# Patient Record
Sex: Female | Born: 1990 | Race: Black or African American | Hispanic: No | Marital: Single | Smoking: Never smoker
Health system: Southern US, Community
[De-identification: ages and names within clinical notes are randomized; demographics above are authoritative.]

## PROBLEM LIST (undated history)

## (undated) ENCOUNTER — Inpatient Hospital Stay (HOSPITAL_COMMUNITY): Payer: Self-pay

## (undated) DIAGNOSIS — R51 Headache: Secondary | ICD-10-CM

## (undated) HISTORY — PX: HERNIA REPAIR: SHX51

## (undated) HISTORY — PX: WISDOM TOOTH EXTRACTION: SHX21

---
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
## 2008-06-24 ENCOUNTER — Emergency Department (HOSPITAL_COMMUNITY): Admission: EM | Admit: 2008-06-24 | Discharge: 2008-06-25 | Payer: Self-pay | Admitting: Emergency Medicine

## 2008-11-04 ENCOUNTER — Emergency Department (HOSPITAL_COMMUNITY): Admission: EM | Admit: 2008-11-04 | Discharge: 2008-11-04 | Payer: Self-pay | Admitting: Family Medicine

## 2008-11-08 ENCOUNTER — Emergency Department (HOSPITAL_COMMUNITY): Admission: EM | Admit: 2008-11-08 | Discharge: 2008-11-08 | Payer: Self-pay | Admitting: Emergency Medicine

## 2009-06-06 ENCOUNTER — Emergency Department (HOSPITAL_COMMUNITY): Admission: EM | Admit: 2009-06-06 | Discharge: 2009-06-07 | Payer: Self-pay | Admitting: Emergency Medicine

## 2010-08-16 ENCOUNTER — Emergency Department (HOSPITAL_COMMUNITY): Admission: EM | Admit: 2010-08-16 | Discharge: 2010-08-16 | Payer: Self-pay | Admitting: Family Medicine

## 2011-01-23 LAB — DIFFERENTIAL
Eosinophils Absolute: 0.2 10*3/uL (ref 0.0–0.7)
Eosinophils Relative: 2 % (ref 0–5)
Lymphs Abs: 3.1 10*3/uL (ref 0.7–4.0)
Monocytes Absolute: 0.9 10*3/uL (ref 0.1–1.0)
Monocytes Relative: 8 % (ref 3–12)

## 2011-01-23 LAB — POCT I-STAT, CHEM 8
BUN: 13 mg/dL (ref 6–23)
Calcium, Ion: 1.2 mmol/L (ref 1.12–1.32)
Creatinine, Ser: 0.7 mg/dL (ref 0.4–1.2)
Glucose, Bld: 97 mg/dL (ref 70–99)
Hemoglobin: 13.6 g/dL (ref 12.0–15.0)
TCO2: 24 mmol/L (ref 0–100)

## 2011-01-23 LAB — CBC
HCT: 37.8 % (ref 36.0–46.0)
Hemoglobin: 12.8 g/dL (ref 12.0–15.0)
MCV: 85.7 fL (ref 78.0–100.0)
Platelets: 267 10*3/uL (ref 150–400)
WBC: 11 10*3/uL — ABNORMAL HIGH (ref 4.0–10.5)

## 2011-02-01 LAB — CBC
HCT: 38.5 % (ref 36.0–46.0)
MCHC: 33.3 g/dL (ref 30.0–36.0)
MCV: 85.5 fL (ref 78.0–100.0)
Platelets: 370 10*3/uL (ref 150–400)

## 2011-02-01 LAB — DIFFERENTIAL
Basophils Relative: 0 % (ref 0–1)
Eosinophils Absolute: 0.4 10*3/uL (ref 0.0–0.7)
Eosinophils Relative: 2 % (ref 0–5)
Neutrophils Relative %: 81 % — ABNORMAL HIGH (ref 43–77)

## 2011-02-01 LAB — POCT I-STAT, CHEM 8
Creatinine, Ser: 0.8 mg/dL (ref 0.4–1.2)
Glucose, Bld: 121 mg/dL — ABNORMAL HIGH (ref 70–99)
HCT: 44 % (ref 36.0–46.0)
Hemoglobin: 15 g/dL (ref 12.0–15.0)
Potassium: 3.4 mEq/L — ABNORMAL LOW (ref 3.5–5.1)
Sodium: 142 mEq/L (ref 135–145)
TCO2: 25 mmol/L (ref 0–100)

## 2011-03-02 NOTE — Op Note (Signed)
NAMECLARITA, April Malone                ACCOUNT NO.:  [id number]   MEDICAL RECORD NO.:  [id number]          PATIENT TYPE:  EMS   LOCATION:  ED                           FACILITY:  [facility]   PHYSICIAN:  Antony Contras, MD     DATE OF BIRTH:  1991-09-17   DATE OF PROCEDURE:  11/08/2008  DATE OF DISCHARGE:  11/08/2008                               OPERATIVE REPORT   PREOPERATIVE DIAGNOSIS:  Right peritonsillar abscess.   POSTOPERATIVE DIAGNOSIS:  Right peritonsillar abscess.   PROCEDURE:  Incision and drainage of right peritonsillar abscess.   SURGEON:  Antony Contras, MD   ANESTHESIA:  Local.   COMPLICATIONS:  None.   INDICATIONS:  Patient is a 20 year old African American female who has a  10-day history of sore throat that has progressed and become right-  sided.  The patient has fullness of the right peritonsillar region,  pushing the uvula to the left side.  Incision and drainage was  recommended.   FINDINGS:  Physical findings are as above.  Upon incision, there was not  flow of yellow pus, but there was a cavity entered with the hemostat  with dark blood encountered.   DESCRIPTION OF PROCEDURE:  The patient was identified in the emergency  department, and informed consent was obtained, including discussion of  risks, benefits and alternatives.  The right oropharynx was sprayed with  Cetacaine 3 different times and then injected with 1% lidocaine with  1:100,000 epinephrine.  A horizontal incision was made with an 11 blade  scalpel just above the right tonsil, and the wound was then dissected  using a curved hemostat.  Without finding anything, an 18-gauge needle  and a 10-cc syringe was used to aspirate through the region.  Once of  these passes yielded about 2 cc of dark red fluid.  This was followed by  the hemostat which entered into a cavity deep to the tonsil.  The  patient tolerated the procedure well without complication.  Patient was  returned to the emergency room  care.      Antony Contras, MD  Electronically Signed     DDB/MEDQ  D:  11/08/2008  T:  11/08/2008  Job:  [id number]

## 2011-03-02 NOTE — Consult Note (Signed)
April Malone, April Malone                ACCOUNT NO.:  [id number]   MEDICAL RECORD NO.:  [id number]          PATIENT TYPE:  EMS   LOCATION:  ED                           FACILITY:  [facility]   PHYSICIAN:  Antony Contras, MD     DATE OF BIRTH:  December 16, 1990   DATE OF CONSULTATION:  11/08/2008  DATE OF DISCHARGE:  11/08/2008                                 CONSULTATION   CHIEF COMPLAINT:  Sore throat.   HISTORY OF PRESENT ILLNESS:  Patient is a 20 year old African American  female who developed sore throat about 10 days ago that has gradually  progressed.  She went to urgent care 3 days ago with pain becoming more  isolated to the right side and having difficulty swallowing.  She was  diagnosed with strep throat without a strep test and prescribed  penicillin.  She has worsened over the last few days, however, to have  severe pain, making it difficult to swallow her secretions.  She also  has otalgia in the right ear.  She presents to the emergency department  with these symptoms.  She has no other complaints.   PAST MEDICAL HISTORY:  None.   PAST SURGICAL HISTORY:  None.   MEDICATIONS:  Penicillin.   ALLERGIES:  No known drug allergies.   FAMILY HISTORY:  None.   Denies alcohol or smoking.   REVIEW OF SYSTEMS:  Negative except as listed above.   PHYSICAL EXAMINATION:  Temperature 98.6, blood pressure 129/86, pulse  93, respirations 18.  GENERAL:  Patient is in no acute distress and is pleasant and  cooperative.  EYES:  Extraocular movements are intact.  Pupils are equal, round and  reactive to light.  EARS:  External ears are normal.  External canals are patent.  Tympanic  membranes are intact with aerated middle ears.  NOSE:  External nose is normal.  Nasal passages are patent with relative  midline septum.  OROPHARYNX/ORAL CAVITY:  Lips, teeth, and gums are normal.  The tongue,  floor of mouth, buccal mucosa, and palate are normal.  The oropharynx  reveals fullness of the right  peritonsillar region, pushing the uvula  towards the left side.  There is exudate on the tonsil.  FACE:  No abnormalities.  NECK:  Tender in zone 2 on the right side but otherwise no mass or  deformity.  LYMPHATICS:  No enlargement of the neck.  THYROID:  Normal to palpation.  SALIVARY GLANDS:  Normal to palpation.  CRANIAL NERVES II-XII:  Grossly intact.   LABS:  White blood count 16.8 with 89% neutrophils.   ASSESSMENT:  Patient is a 20 year old African American female with a  right peritonsillar abscess.   PLAN:  The abscess will be drained in the emergency department under a  local anesthetic.  Risks, benefits and alternatives were discussed.  Patient will be discharged on Augmentin, Medrol Dosepak, and Lortab  Elixir.  She was given intravenous Clindamycin and Solu-Medrol in the  emergency department.  Followup will be in 1 week in my office.      Antony Contras, MD  Electronically  Signed     DDB/MEDQ  D:  11/08/2008  T:  11/08/2008  Job:  [id number]

## 2011-07-21 LAB — RAPID STREP SCREEN (MED CTR MEBANE ONLY): Streptococcus, Group A Screen (Direct): NEGATIVE

## 2011-07-28 LAB — CBC
HCT: 36.7
Hemoglobin: 11.8 — ABNORMAL LOW
MCHC: 34.1
MCV: 89.8
Platelets: 198
RDW: 12.7
RDW: 12.8
WBC: 10.2 — ABNORMAL HIGH

## 2011-07-28 LAB — RPR: RPR Ser Ql: NONREACTIVE

## 2011-07-29 LAB — URINALYSIS, ROUTINE W REFLEX MICROSCOPIC
Bilirubin Urine: NEGATIVE
Glucose, UA: NEGATIVE
Hgb urine dipstick: NEGATIVE
Ketones, ur: NEGATIVE
Nitrite: NEGATIVE
Protein, ur: NEGATIVE
Protein, ur: NEGATIVE
Specific Gravity, Urine: <1.005 — ABNORMAL LOW
Urobilinogen, UA: 0.2
pH: 7

## 2011-07-29 LAB — URINE MICROSCOPIC-ADD ON

## 2011-07-30 LAB — URINALYSIS, ROUTINE W REFLEX MICROSCOPIC
Bilirubin Urine: NEGATIVE
Glucose, UA: NEGATIVE
Nitrite: NEGATIVE
Specific Gravity, Urine: 1.025
pH: 5.5

## 2011-07-30 LAB — URINE MICROSCOPIC-ADD ON: RBC / HPF: NONE SEEN

## 2012-07-13 ENCOUNTER — Inpatient Hospital Stay (HOSPITAL_COMMUNITY)
Admission: AD | Admit: 2012-07-13 | Discharge: 2012-07-14 | Disposition: A | Discharge status: Home / Self Care | Payer: Self-pay | Source: Ambulatory Visit | Attending: Obstetrics | Admitting: Obstetrics

## 2012-07-13 ENCOUNTER — Encounter (HOSPITAL_COMMUNITY): Payer: Self-pay | Admitting: *Deleted

## 2012-07-13 DIAGNOSIS — N920 Excessive and frequent menstruation with regular cycle: Secondary | ICD-10-CM | POA: Insufficient documentation

## 2012-07-13 DIAGNOSIS — N921 Excessive and frequent menstruation with irregular cycle: Secondary | ICD-10-CM

## 2012-07-13 HISTORY — DX: Headache: R51

## 2012-07-13 NOTE — MAU Note (Signed)
Pt reports she had Implanon replaced in Sept. States she has been spotting for about 5 weeks. States over the last few days has noticed "a few clots" but is still only spotting. Also reports a headache off/on since the bleeding started

## 2012-07-14 ENCOUNTER — Inpatient Hospital Stay (HOSPITAL_COMMUNITY): Payer: Self-pay

## 2012-07-14 DIAGNOSIS — N920 Excessive and frequent menstruation with regular cycle: Secondary | ICD-10-CM

## 2012-07-14 LAB — GC/CHLAMYDIA PROBE AMP, GENITAL: GC Probe Amp, Genital: NEGATIVE

## 2012-07-14 LAB — CBC
MCH: 28.1 pg (ref 26.0–34.0)
MCV: 82.8 fL (ref 78.0–100.0)
Platelets: 285 10*3/uL (ref 150–400)
RDW: 12.5 % (ref 11.5–15.5)
WBC: 10.5 10*3/uL (ref 4.0–10.5)

## 2012-07-14 LAB — WET PREP, GENITAL: Yeast Wet Prep HPF POC: NONE SEEN

## 2012-07-14 MED ORDER — MEGESTROL ACETATE 40 MG PO TABS: ORAL_TABLET | ORAL | Status: DC

## 2012-07-14 NOTE — MAU Provider Note (Signed)
Chief Complaint: Vaginal Bleeding   First Provider Initiated Contact with Patient 07/14/12 0023     SUBJECTIVE HPI: April Malone is a 21 y.o. G1P1001 who presents to maternity admissions reporting bleeding x4-5 weeks alternating between spotting and heavy bleeding with clots.  She has had an Implanon for 1 year but has not had bleeding like this until now.  She reports good fetal movement, denies vaginal itching/burning, urinary symptoms, h/a, dizziness, n/v, or fever/chills.     Past Medical History  Diagnosis Date  . Headache    Past Surgical History  Procedure Date  . Hernia repair    History   Social History  . Marital Status: Single    Spouse Name: N/A    Number of Children: N/A  . Years of Education: N/A   Occupational History  . Not on file.   Social History Main Topics  . Smoking status: Never Smoker   . Smokeless tobacco: Not on file  . Alcohol Use: No  . Drug Use: No  . Sexually Active:    Other Topics Concern  . Not on file   Social History Narrative  . No narrative on file   No current facility-administered medications on file prior to encounter.   No current outpatient prescriptions on file prior to encounter.   Allergies not on file  ROS: Pertinent items in HPI  OBJECTIVE Blood pressure 119/77, pulse 85, temperature 99.1 F (37.3 C), temperature source Oral, resp. rate 16, height 6\' 5"  (1.956 m), weight 101.606 kg (224 lb), SpO2 100.00%. GENERAL: Well-developed, well-nourished female in no acute distress.  HEENT: Normocephalic HEART: normal rate RESP: normal effort ABDOMEN: Soft, non-tender EXTREMITIES: Nontender, no edema NEURO: Alert and oriented Pelvic exam: Cervix pink, visually closed, without lesion, moderate amount bright red bleeding without clots, vaginal walls and external genitalia normal Bimanual exam: Cervix 0/long/high, firm, anterior, neg CMT, uterus  Mildly tender, nonenlarged, adnexa without tenderness, enlargement, or  mass  LAB RESULTS Results for orders placed during the hospital encounter of 07/13/12 (from the past 24 hour(s))  POCT PREGNANCY, URINE     Status: Normal   Collection Time   07/13/12  9:11 PM      Component Value Range   Preg Test, Ur NEGATIVE  NEGATIVE  CBC     Status: Normal   Collection Time   07/13/12 11:40 PM      Component Value Range   WBC 10.5  4.0 - 10.5 K/uL   RBC 4.48  3.87 - 5.11 MIL/uL   Hemoglobin 12.6  12.0 - 15.0 g/dL   HCT 16.1  09.6 - 04.5 %   MCV 82.8  78.0 - 100.0 fL   MCH 28.1  26.0 - 34.0 pg   MCHC 34.0  30.0 - 36.0 g/dL   RDW 40.9  81.1 - 91.4 %   Platelets 285  150 - 400 K/uL  WET PREP, GENITAL     Status: Abnormal   Collection Time   07/14/12 12:25 AM      Component Value Range   Yeast Wet Prep HPF POC NONE SEEN  NONE SEEN   Trich, Wet Prep NONE SEEN  NONE SEEN   Clue Cells Wet Prep HPF POC NONE SEEN  NONE SEEN   WBC, Wet Prep HPF POC FEW (*) NONE SEEN    IMAGING US Transvaginal Non-ob  07/14/2012  *RADIOLOGY REPORT*  Clinical Data: 21 year old female with spotting following implantable contraceptive.  TRANSABDOMINAL AND TRANSVAGINAL ULTRASOUND OF PELVIS Technique:  Both  transabdominal and transvaginal ultrasound examinations of the pelvis were performed. Transabdominal technique was performed for global imaging of the pelvis including uterus, ovaries, adnexal regions, and pelvic cul-de-sac.  It was necessary to proceed with endovaginal exam following the transabdominal exam to visualize the the adnexa.  Comparison:  None.  Findings:  Uterus: Retroflexed.  Normal echotexture.  7.0 x 3.7 x 5.2 cm.  Endometrium: Trilaminar and homogeneous, 6 mm in thickness.  Right ovary:  Normal with multiple small follicles.  4.6 x 2.6 x 3.0 cm.  Left ovary: Normal with multiple small follicles.  4.3 x 2.2 x 2.3 cm.  Other findings: Trace simple appearing free fluid in the cul-de- sac.  IMPRESSION: Normal study. No evidence of pelvic mass or other significant abnormality.    Original Report Authenticated By: Harley Hallmark, M.D.    US Pelvis Complete  07/14/2012  *RADIOLOGY REPORT*  Clinical Data: 21 year old female with spotting following implantable contraceptive.  TRANSABDOMINAL AND TRANSVAGINAL ULTRASOUND OF PELVIS Technique:  Both transabdominal and transvaginal ultrasound examinations of the pelvis were performed. Transabdominal technique was performed for global imaging of the pelvis including uterus, ovaries, adnexal regions, and pelvic cul-de-sac.  It was necessary to proceed with endovaginal exam following the transabdominal exam to visualize the the adnexa.  Comparison:  None.  Findings:  Uterus: Retroflexed.  Normal echotexture.  7.0 x 3.7 x 5.2 cm.  Endometrium: Trilaminar and homogeneous, 6 mm in thickness.  Right ovary:  Normal with multiple small follicles.  4.6 x 2.6 x 3.0 cm.  Left ovary: Normal with multiple small follicles.  4.3 x 2.2 x 2.3 cm.  Other findings: Trace simple appearing free fluid in the cul-de- sac.  IMPRESSION: Normal study. No evidence of pelvic mass or other significant abnormality.   Original Report Authenticated By: Harley Hallmark, M.D.     ASSESSMENT 1. Menometrorrhagia     PLAN Discharge home Megace taper Pt given contact info for Gyn clinic to f/u as needed Return to MAU as needed   Sharen Counter Certified Nurse-Midwife 07/14/2012  12:27 AM

## 2013-02-01 ENCOUNTER — Encounter (HOSPITAL_COMMUNITY): Payer: Self-pay | Admitting: *Deleted

## 2013-02-01 ENCOUNTER — Inpatient Hospital Stay (HOSPITAL_COMMUNITY)
Admission: AD | Admit: 2013-02-01 | Discharge: 2013-02-02 | Disposition: A | Discharge status: Home / Self Care | Payer: Self-pay | Source: Ambulatory Visit | Attending: Obstetrics & Gynecology | Admitting: Obstetrics & Gynecology

## 2013-02-01 DIAGNOSIS — L738 Other specified follicular disorders: Secondary | ICD-10-CM | POA: Insufficient documentation

## 2013-02-01 DIAGNOSIS — N9089 Other specified noninflammatory disorders of vulva and perineum: Secondary | ICD-10-CM

## 2013-02-01 DIAGNOSIS — N909 Noninflammatory disorder of vulva and perineum, unspecified: Secondary | ICD-10-CM | POA: Insufficient documentation

## 2013-02-01 NOTE — MAU Note (Signed)
Pt reports tonight she noticed "red bumps" on the left side of her vagina.burning sensation

## 2013-02-02 DIAGNOSIS — N9089 Other specified noninflammatory disorders of vulva and perineum: Secondary | ICD-10-CM

## 2013-02-02 NOTE — MAU Provider Note (Signed)
Chief Complaint: Groin Swelling   First Provider Initiated Contact with Patient 02/02/13 0049     SUBJECTIVE HPI: April Malone is a 22 y.o. G46P1001 female who presents with noticing a small red bump on her left labia immediately prior to MAU visit. Experienced burning in that location when she urinated. Describes the bump as red, non-vesicular, mildly tender. No Hx of same or HSV. Denies burning other than when she urinated. Denies vaginal discharge, VB, new sex partners.   Past Medical History  Diagnosis Date  . Headache    OB History   Grav Para Term Preterm Abortions TAB SAB Ect Mult Living   1 1 1       1      # Outc Date GA Lbr Len/2nd Wgt Sex Del Anes PTL Lv   1 TRM              Past Surgical History  Procedure Laterality Date  . Hernia repair     History   Social History  . Marital Status: Single    Spouse Name: N/A    Number of Children: N/A  . Years of Education: N/A   Occupational History  . Not on file.   Social History Main Topics  . Smoking status: Never Smoker   . Smokeless tobacco: Not on file  . Alcohol Use: No  . Drug Use: No  . Sexually Active: Yes    Birth Control/ Protection: Implant   Other Topics Concern  . Not on file   Social History Narrative  . No narrative on file   No current facility-administered medications on file prior to encounter.   No current outpatient prescriptions on file prior to encounter.   No Known Allergies  ROS: Pertinent items in HPI  OBJECTIVE Blood pressure 142/81, pulse 87, temperature 98.6 F (37 C), temperature source Oral, resp. rate 18, height 5' 3.5" (1.613 m), weight 101.152 kg (223 lb), last menstrual period 01/06/2013, SpO2 100.00%. GENERAL: Well-developed, well-nourished female in no acute distress. Anxious.  HEENT: Normocephalic HEART: normal rate RESP: normal effort ABDOMEN: Soft, non-tender EXTREMITIES: Nontender, no edema NEURO: Alert and oriented PELVIC EXAM: NEFG. Slightly erythematous 1  mm raised bump on left labia majora. Non-vesicular. Very difficult to find. Not able to culture. No discharge.   LAB RESULTS No results found for this or any previous visit (from the past 24 hour(s)).  IMAGING No results found.  MAU COURSE  ASSESSMENT 1. Labial lesion--folliculitis vs excoriated hair follicle    PLAN Discharge home.     Follow-up Information   Follow up with [facility]. (As needed of lesion worsens)    Contact information:   [address] [place] [address] [phone]       Medication List    STOP taking these medications       megestrol 40 MG tablet  Commonly known as:  MEGACE       Dorathy Kinsman, CNM 02/02/2013  1:26 AM

## 2013-02-03 NOTE — MAU Provider Note (Signed)
Attestation of Attending Supervision of Advanced Practitioner (PA/CNM/NP): Evaluation and management procedures were performed by the Advanced Practitioner under my supervision and collaboration.  I have reviewed the Advanced Practitioner's note and chart, and I agree with the management and plan.  Elani Delph, MD, FACOG Attending Obstetrician & Gynecologist Faculty Practice, [facility]  

## 2013-06-24 ENCOUNTER — Encounter (HOSPITAL_COMMUNITY): Payer: Self-pay | Admitting: *Deleted

## 2013-06-24 ENCOUNTER — Inpatient Hospital Stay (HOSPITAL_COMMUNITY)
Admission: AD | Admit: 2013-06-24 | Discharge: 2013-06-24 | Disposition: A | Discharge status: Home / Self Care | Payer: Self-pay | Source: Ambulatory Visit | Attending: Obstetrics & Gynecology | Admitting: Obstetrics & Gynecology

## 2013-06-24 DIAGNOSIS — L293 Anogenital pruritus, unspecified: Secondary | ICD-10-CM | POA: Insufficient documentation

## 2013-06-24 DIAGNOSIS — N644 Mastodynia: Secondary | ICD-10-CM | POA: Insufficient documentation

## 2013-06-24 DIAGNOSIS — B373 Candidiasis of vulva and vagina: Secondary | ICD-10-CM

## 2013-06-24 DIAGNOSIS — B3731 Acute candidiasis of vulva and vagina: Secondary | ICD-10-CM | POA: Insufficient documentation

## 2013-06-24 LAB — URINALYSIS, ROUTINE W REFLEX MICROSCOPIC
Bilirubin Urine: NEGATIVE
Ketones, ur: NEGATIVE mg/dL
Leukocytes, UA: NEGATIVE
Nitrite: NEGATIVE
Protein, ur: NEGATIVE mg/dL

## 2013-06-24 LAB — WET PREP, GENITAL: Clue Cells Wet Prep HPF POC: NONE SEEN

## 2013-06-24 NOTE — MAU Provider Note (Signed)
Attestation of Attending Supervision of Advanced Practitioner (CNM/NP): Evaluation and management procedures were performed by the Advanced Practitioner under my supervision and collaboration.  I have reviewed the Advanced Practitioner's note and chart, and I agree with the management and plan.  HARRAWAY-SMITH, Arseniy Toomey 2:03 PM

## 2013-06-24 NOTE — MAU Note (Signed)
Pt presents with complaints of breast pain that started approximately a week ago and vaginal irritation that started a couple of weeks ago. She says that she has had a yeast infection before and states that the vaginal irritation feels like it did then.

## 2013-06-24 NOTE — MAU Provider Note (Signed)
CC: Breast Pain and Vaginal Itching    First Provider Initiated Contact with Patient 06/24/13 1132      HPI STACI DACK is a 22 y.o. G1P1001 who presents with onset about 1 week ago of bilateral diffuse breast pain left greater than right.  No nipple discharge, fever, lumps. Caffeine intake is low. No similar previous or cyclic episodes. Implanon for contraception. LMP 2 wks aqo, regular monthly menses. Denies abnormal bleeding Also has one week history of vulvovaginal itching. She states her discharge is thick and white and she believes this is similar to symptoms she's had in the past with yeast. No new soaps or OTC tx.   Past Medical History  Diagnosis Date  . Headache(784.0)     OB History  Gravida Para Term Preterm AB SAB TAB Ectopic Multiple Living  1 1 1       1     # Outcome Date GA Lbr Len/2nd Weight Sex Delivery Anes PTL Lv  1 TRM               Past Surgical History  Procedure Laterality Date  . Hernia repair      History   Social History  . Marital Status: Single    Spouse Name: N/A    Number of Children: N/A  . Years of Education: N/A   Occupational History  . Not on file.   Social History Main Topics  . Smoking status: Never Smoker   . Smokeless tobacco: Not on file  . Alcohol Use: No  . Drug Use: No  . Sexual Activity: Yes    Birth Control/ Protection: Implant   Other Topics Concern  . Not on file   Social History Narrative  . No narrative on file    No current facility-administered medications on file prior to encounter.   No current outpatient prescriptions on file prior to encounter.    No Known Allergies  ROS Pertinent items in HPI  PHYSICAL EXAM Filed Vitals:   06/24/13 1123  BP: 132/87  Pulse: 96  Temp: 97.9 F (36.6 C)  Resp: 18   General: Well nourished, well developed female in no acute distress Cardiovascular: Normal rate Respiratory: Normal effort Breasts: Diffusely tender to palpation particularly left rest in  lower quadrants. There are no skin changes, nipple discharge, masses, lymphadenopathy.Abdomen: Soft, nontender Back: No CVAT Extremities: No edema Neurologic: Alert and oriented Speculum exam: NEFG, no erythema; vagina with thick adherent white discharge, no blood; cervix clean Bimanual exam: cervix closed, no CMT; uterus NSSP; no adnexal tenderness or masses   LAB RESULTS Results for orders placed during the hospital encounter of 06/24/13 (from the past 24 hour(s))  URINALYSIS, ROUTINE W REFLEX MICROSCOPIC     Status: None   Collection Time    06/24/13 11:05 AM      Result Value Range   Color, Urine YELLOW  YELLOW   APPearance CLEAR  CLEAR   Specific Gravity, Urine 1.020  1.005 - 1.030   pH 7.5  5.0 - 8.0   Glucose, UA NEGATIVE  NEGATIVE mg/dL   Hgb urine dipstick NEGATIVE  NEGATIVE   Bilirubin Urine NEGATIVE  NEGATIVE   Ketones, ur NEGATIVE  NEGATIVE mg/dL   Protein, ur NEGATIVE  NEGATIVE mg/dL   Urobilinogen, UA 0.2  0.0 - 1.0 mg/dL   Nitrite NEGATIVE  NEGATIVE   Leukocytes, UA NEGATIVE  NEGATIVE  POCT PREGNANCY, URINE     Status: None   Collection Time    06/24/13  11:14 AM      Result Value Range   Preg Test, Ur NEGATIVE  NEGATIVE  WET PREP, GENITAL     Status: Abnormal   Collection Time    06/24/13 11:30 AM      Result Value Range   Yeast Wet Prep HPF POC NONE SEEN  NONE SEEN   Trich, Wet Prep NONE SEEN  NONE SEEN   Clue Cells Wet Prep HPF POC NONE SEEN  NONE SEEN   WBC, Wet Prep HPF POC FEW (*) NONE SEEN      ASSESSMENT  1. Mastalgia   Presumptive yeast vaginitis  PLAN D/W Dr. Erin Fulling.  Limit caffeine and keep diary of pain. Continue Tylenol and ibuprofen. May use warm compresses or ice as needed for comfort. Reevaluate next month if continues after menstrual cycle.  Discharge home.  See AVS for patient education.    Medication List         ibuprofen 200 MG tablet  Commonly known as:  ADVIL,MOTRIN  Take 400 mg by mouth every 6 (six) hours as  needed for pain.     multivitamin with minerals Tabs tablet  Take 1 tablet by mouth daily.       May take ibuprofen 600 mg q6h prn   Danae Orleans, CNM 06/24/2013 11:39 AM

## 2013-06-25 LAB — GC/CHLAMYDIA PROBE AMP
CT Probe RNA: NEGATIVE
GC Probe RNA: NEGATIVE

## 2013-07-19 ENCOUNTER — Encounter: Payer: Self-pay | Admitting: Advanced Practice Midwife

## 2013-10-04 ENCOUNTER — Encounter (HOSPITAL_COMMUNITY): Payer: Self-pay | Admitting: *Deleted

## 2013-10-04 ENCOUNTER — Inpatient Hospital Stay (HOSPITAL_COMMUNITY)
Admission: AD | Admit: 2013-10-04 | Discharge: 2013-10-04 | Disposition: A | Discharge status: Home / Self Care | Payer: Medicaid Other | Source: Ambulatory Visit | Attending: Obstetrics & Gynecology | Admitting: Obstetrics & Gynecology

## 2013-10-04 DIAGNOSIS — N946 Dysmenorrhea, unspecified: Secondary | ICD-10-CM

## 2013-10-04 DIAGNOSIS — N949 Unspecified condition associated with female genital organs and menstrual cycle: Secondary | ICD-10-CM

## 2013-10-04 DIAGNOSIS — N938 Other specified abnormal uterine and vaginal bleeding: Secondary | ICD-10-CM | POA: Insufficient documentation

## 2013-10-04 DIAGNOSIS — Z975 Presence of (intrauterine) contraceptive device: Secondary | ICD-10-CM | POA: Insufficient documentation

## 2013-10-04 LAB — URINALYSIS, ROUTINE W REFLEX MICROSCOPIC
Bilirubin Urine: NEGATIVE
Ketones, ur: NEGATIVE mg/dL
Nitrite: NEGATIVE
Specific Gravity, Urine: 1.02 (ref 1.005–1.030)
pH: 6 (ref 5.0–8.0)

## 2013-10-04 LAB — CBC
HCT: 35.5 % — ABNORMAL LOW (ref 36.0–46.0)
Hemoglobin: 12.3 g/dL (ref 12.0–15.0)
MCHC: 34.6 g/dL (ref 30.0–36.0)
RDW: 12.2 % (ref 11.5–15.5)
WBC: 10.6 10*3/uL — ABNORMAL HIGH (ref 4.0–10.5)

## 2013-10-04 LAB — WET PREP, GENITAL
Clue Cells Wet Prep HPF POC: NONE SEEN
Trich, Wet Prep: NONE SEEN
Yeast Wet Prep HPF POC: NONE SEEN

## 2013-10-04 LAB — URINE MICROSCOPIC-ADD ON

## 2013-10-04 MED ORDER — NORGESTIMATE-ETH ESTRADIOL 0.25-35 MG-MCG PO TABS: 1.0000 | ORAL_TABLET | Freq: Every day | ORAL | Status: DC

## 2013-10-04 MED ORDER — TRAMADOL HCL 50 MG PO TABS: 50.0000 mg | ORAL_TABLET | Freq: Four times a day (QID) | ORAL | Status: DC | PRN

## 2013-10-04 MED ORDER — KETOROLAC TROMETHAMINE 60 MG/2ML IM SOLN
60.0000 mg | Freq: Once | INTRAMUSCULAR | Status: AC
  Administered 2013-10-04: 60 mg via INTRAMUSCULAR
  Filled 2013-10-04: qty 2

## 2013-10-04 NOTE — MAU Note (Signed)
Pt. Started bleeding around October 15th and she has been bleeding since then.  The bleeding has been constant and she has been changing her pad q 1-2 hours. She is also having intense cramping on and off since October.  Pt. Has also recently started having headaches.

## 2013-10-04 NOTE — MAU Provider Note (Signed)
CC: Vaginal Bleeding  HPI April Malone is a 22 y.o. G1P1001 with Nexplanon in place x 2 years has had vagina bleeding continuously since October 15. Flow waxes and wanes with 2-3 day periods of light flow, but mostly heavy requiring several pads per day. Bleeding was heavier when she passed a clot and felt weak last night. Denies orthostatic symptoms now. Prior to this episode she had some bleeding in the summer. She was seen in MAU 06/30/12 for vaginal bleeding and had normal [place] and successful treatment with a Megace taper.  Requests STI testing. Denies irritative discharge or new partner.    Past Medical History  Diagnosis Date  . Headache(784.0)     OB History  Gravida Para Term Preterm AB SAB TAB Ectopic Multiple Living  1 1 1       1     # Outcome Date GA Lbr Len/2nd Weight Sex Delivery Anes PTL Lv  1 TRM               Past Surgical History  Procedure Laterality Date  . Hernia repair      History   Social History  . Marital Status: Single    Spouse Name: N/A    Number of Children: N/A  . Years of Education: N/A   Occupational History  . Not on file.   Social History Main Topics  . Smoking status: Never Smoker   . Smokeless tobacco: Not on file  . Alcohol Use: No  . Drug Use: No  . Sexual Activity: Yes    Birth Control/ Protection: Implant   Other Topics Concern  . Not on file   Social History Narrative  . No narrative on file    No current facility-administered medications on file prior to encounter.   Current Outpatient Prescriptions on File Prior to Encounter  Medication Sig Dispense Refill  . ibuprofen (ADVIL,MOTRIN) 200 MG tablet Take 400 mg by mouth every 6 (six) hours as needed for pain.      . Multiple Vitamin (MULTIVITAMIN WITH MINERALS) TABS tablet Take 1 tablet by mouth daily.        No Known Allergies  ROS Pertinent items in HPI  PHYSICAL EXAM Filed Vitals:   10/04/13 2027  BP: 125/71  Pulse: 88  Temp: 98.3 F (36.8 C)  Resp: 18    General: Well nourished, well developed female in no acute distress Cardiovascular: Normal rate Respiratory: Normal effort Abdomen: Soft, nontender Back: No CVAT Extremities: No edema Neurologic: Alert and oriented Speculum exam: NEFG; vagina with moderate dark blood; cervix clean Bimanual exam: cervix closed, no CMT; uterus retroverted and difficult to outline due to body habitus; no adnexal tenderness or masses noted  LAB RESULTS Results for orders placed during the hospital encounter of 10/04/13 (from the past 24 hour(s))  WET PREP, GENITAL     Status: Abnormal   Collection Time    10/04/13  8:50 PM      Result Value Range   Yeast Wet Prep HPF POC NONE SEEN  NONE SEEN   Trich, Wet Prep NONE SEEN  NONE SEEN   Clue Cells Wet Prep HPF POC NONE SEEN  NONE SEEN   WBC, Wet Prep HPF POC RARE (*) NONE SEEN  CBC     Status: Abnormal   Collection Time    10/04/13  9:00 PM      Result Value Range   WBC 10.6 (*) 4.0 - 10.5 K/uL   RBC 4.29  3.87 -  5.11 MIL/uL   Hemoglobin 12.3  12.0 - 15.0 g/dL   HCT 16.1 (*) 09.6 - 04.5 %   MCV 82.8  78.0 - 100.0 fL   MCH 28.7  26.0 - 34.0 pg   MCHC 34.6  30.0 - 36.0 g/dL   RDW 40.9  81.1 - 91.4 %   Platelets 302  150 - 400 K/uL  URINALYSIS, ROUTINE W REFLEX MICROSCOPIC     Status: Abnormal   Collection Time    10/04/13 10:10 PM      Result Value Range   Color, Urine YELLOW  YELLOW   APPearance CLEAR  CLEAR   Specific Gravity, Urine 1.020  1.005 - 1.030   pH 6.0  5.0 - 8.0   Glucose, UA NEGATIVE  NEGATIVE mg/dL   Hgb urine dipstick LARGE (*) NEGATIVE   Bilirubin Urine NEGATIVE  NEGATIVE   Ketones, ur NEGATIVE  NEGATIVE mg/dL   Protein, ur NEGATIVE  NEGATIVE mg/dL   Urobilinogen, UA 0.2  0.0 - 1.0 mg/dL   Nitrite NEGATIVE  NEGATIVE   Leukocytes, UA NEGATIVE  NEGATIVE  URINE MICROSCOPIC-ADD ON     Status: None   Collection Time    10/04/13 10:10 PM      Result Value Range   Squamous Epithelial / LPF RARE  RARE   WBC, UA 0-2  <3 WBC/hpf    RBC / HPF 11-20  <3 RBC/hpf   Bacteria, UA RARE  RARE    IMAGING No results found.  MAU COURSE GC.CT sent Care assumed by [place] CNM at 2200.  Danae Orleans, CNM 10/04/2013 8:57 PM  Dorathy Kinsman, CNM assumed care of pt at 2100. Wet prep pending.   UA, Toradol ordered.   Pain resolved. Bleeding stable  ASSESSMENT 1. Other disorder of menstruation and other abnormal bleeding from female genital tract   2. Presence of subcutaneous contraceptive implant   3. Dysmenorrhea    PLAN D/C home in stable condition. Bleeding precautions. Increase fluids and iron-rich foods.  Follow-up Information   Follow up with Gynecologist. (for management of irregular or heavy bleeding)        Medication List         acetaminophen 500 MG tablet  Commonly known as:  TYLENOL  Take 1,000 mg by mouth every 6 (six) hours as needed for moderate pain.     ibuprofen 200 MG tablet  Commonly known as:  ADVIL,MOTRIN  Take 600 mg by mouth every 6 (six) hours as needed for pain.     multivitamin with minerals Tabs tablet  Take 1 tablet by mouth daily.     naproxen sodium 220 MG tablet  Commonly known as:  ANAPROX  Take 440 mg by mouth 2 (two) times daily with a meal.     norgestimate-ethinyl estradiol 0.25-35 MG-MCG tablet  Commonly known as:  ORTHO-CYCLEN,SPRINTEC,PREVIFEM  Take 1 tablet by mouth daily. Take one month of pills when heavy or irreg bleeding occurs.      traMADol 50 MG tablet  Commonly known as:  ULTRAM  Take 1-2 tablets (50-100 mg total) by mouth every 6 (six) hours as needed.       [place], CNM 10/04/2013 11:31 PM

## 2013-10-05 LAB — GC/CHLAMYDIA PROBE AMP: GC Probe RNA: NEGATIVE

## 2013-10-05 NOTE — MAU Provider Note (Signed)
Attestation of Attending Supervision of Advanced Practitioner (PA/CNM/NP): Evaluation and management procedures were performed by the Advanced Practitioner under my supervision and collaboration.  I have reviewed the Advanced Practitioner's note and chart, and I agree with the management and plan.  Emmalyne Giacomo, MD, FACOG Attending Obstetrician & Gynecologist Faculty Practice, [facility]  

## 2013-12-19 ENCOUNTER — Ambulatory Visit (INDEPENDENT_AMBULATORY_CARE_PROVIDER_SITE_OTHER): Payer: Medicaid Other | Admitting: Family Medicine

## 2013-12-19 ENCOUNTER — Encounter: Payer: Self-pay | Admitting: Family Medicine

## 2013-12-19 VITALS — BP 134/84 | HR 98 | Temp 98.8°F | Ht 64.0 in | Wt 223.0 lb

## 2013-12-19 DIAGNOSIS — Z3046 Encounter for surveillance of implantable subdermal contraceptive: Secondary | ICD-10-CM

## 2013-12-19 DIAGNOSIS — Z3049 Encounter for surveillance of other contraceptives: Secondary | ICD-10-CM

## 2013-12-19 DIAGNOSIS — N938 Other specified abnormal uterine and vaginal bleeding: Secondary | ICD-10-CM

## 2013-12-19 DIAGNOSIS — N949 Unspecified condition associated with female genital organs and menstrual cycle: Secondary | ICD-10-CM

## 2013-12-19 MED ORDER — MEDROXYPROGESTERONE ACETATE 104 MG/0.65ML ~~LOC~~ SUSP
104.0000 mg | Freq: Once | SUBCUTANEOUS | Status: AC
  Administered 2013-12-19: 104 mg via SUBCUTANEOUS

## 2013-12-19 NOTE — Patient Instructions (Signed)

## 2013-12-19 NOTE — Progress Notes (Signed)
GYNECOLOGY OFFICE NOTE  Chief Complaint:  nexplanon removal  Primary Care Physician: No primary provider on file.  HPI:  April Malone is a 23 yo who presents for nexplanon removal.   States has had significant difficulty with bleeding with it Gone to the ED twice. Had to have a megace taper once and ocps the second Has had it in for 2 years States wants to go onto depo Has tried to like nexplanon but the bleeding has persisted to the point she has gotten light headed.   No fevers, chills, nausea, vomiting.  No dizziness, sob, tachycardia.    PMHx:  Past Medical History  Diagnosis Date  . XLKGMWNU(272.5Headache(784.0)     Past Surgical History  Procedure Laterality Date  . Hernia repair      FAMHx:  Family History  Problem Relation Age of Onset  . Other Neg Hx     SOCHx:   reports that she has never smoked. She does not have any smokeless tobacco history on file. She reports that she does not drink alcohol or use illicit drugs.  ALLERGIES:  No Known Allergies  ROS: Pertinent ROS as seen in HPI. Otherwise negative.   HOME MEDS: Current Outpatient Prescriptions  Medication Sig Dispense Refill  . aspirin-acetaminophen-caffeine (EXCEDRIN MIGRAINE) 250-250-65 MG per tablet Take by mouth every 6 (six) hours as needed for headache.       No current facility-administered medications for this visit.    LABS/IMAGING: No results found for this or any previous visit (from the past 48 hour(s)). No results found.  VITALS: BP 134/84  Pulse 98  Temp(Src) 98.8 F (37.1 C) (Oral)  Ht 5\' 4"  (1.626 m)  Wt 223 lb (101.152 kg)  BMI 38.26 kg/m2  LMP 12/12/2013  EXAM: Gen: NAD, well appearing PULM: LCTAB, no wheezes/rhonchi/rales CV: RRR, no murmurs ABD: soft, NT, ND EXT: 2+ DP pulses, no edema. nexplanon in left upper arm.     ASSESSMENT: Dysfunctional uterine bleeding  Encounter for Nexplanon removal  Surveillance of other previously prescribed contraceptive method -  Plan: medroxyPROGESTERone (DEPO-SUBQ PROVERA) injection 104 mg  PLAN: patient given informed consent for removal of her Implanon, time out was performed.  Signed copy in the chart.  Appropriate time out taken. Implanon site identified.  Area prepped in usual sterile fashon. One cc of 1% lidocaine was used to anesthetize the area at the distal end of the implant. A small stab incision was made right beside the implant on the distal portion.  The implanon rod was grasped using hemostats and removed without difficulty.  There was less than 3 cc blood loss. There were no complications.  A small amount of antibiotic ointment and steri-strips were applied over the small incision.  A pressure bandage was applied to reduce any bruising.  The patient tolerated the procedure well and was given post procedure instructions.  Depo was given for contraception.

## 2014-01-07 ENCOUNTER — Encounter: Payer: Self-pay | Admitting: *Deleted

## 2014-03-15 ENCOUNTER — Ambulatory Visit (INDEPENDENT_AMBULATORY_CARE_PROVIDER_SITE_OTHER): Payer: Medicaid Other | Admitting: *Deleted

## 2014-03-15 VITALS — BP 119/84 | HR 97 | Temp 97.7°F | Wt 219.0 lb

## 2014-03-15 DIAGNOSIS — IMO0001 Reserved for inherently not codable concepts without codable children: Secondary | ICD-10-CM

## 2014-03-15 DIAGNOSIS — Z3049 Encounter for surveillance of other contraceptives: Secondary | ICD-10-CM

## 2014-03-15 MED ORDER — MEDROXYPROGESTERONE ACETATE 104 MG/0.65ML ~~LOC~~ SUSP
104.0000 mg | Freq: Once | SUBCUTANEOUS | Status: AC
  Administered 2014-03-15: 104 mg via SUBCUTANEOUS

## 2014-05-20 ENCOUNTER — Encounter (HOSPITAL_COMMUNITY): Payer: Self-pay

## 2014-05-20 ENCOUNTER — Inpatient Hospital Stay (HOSPITAL_COMMUNITY)
Admission: AD | Admit: 2014-05-20 | Discharge: 2014-05-20 | Disposition: A | Discharge status: Home / Self Care | Payer: Medicaid Other | Source: Ambulatory Visit | Attending: Obstetrics & Gynecology | Admitting: Obstetrics & Gynecology

## 2014-05-20 DIAGNOSIS — B372 Candidiasis of skin and nail: Secondary | ICD-10-CM

## 2014-05-20 DIAGNOSIS — N949 Unspecified condition associated with female genital organs and menstrual cycle: Secondary | ICD-10-CM | POA: Insufficient documentation

## 2014-05-20 DIAGNOSIS — B3789 Other sites of candidiasis: Secondary | ICD-10-CM | POA: Insufficient documentation

## 2014-05-20 LAB — URINALYSIS, ROUTINE W REFLEX MICROSCOPIC
BILIRUBIN URINE: NEGATIVE
GLUCOSE, UA: NEGATIVE mg/dL
HGB URINE DIPSTICK: NEGATIVE
KETONES UR: 15 mg/dL — AB
Leukocytes, UA: NEGATIVE
Nitrite: NEGATIVE
PH: 6 (ref 5.0–8.0)
Protein, ur: NEGATIVE mg/dL
Specific Gravity, Urine: 1.025 (ref 1.005–1.030)
Urobilinogen, UA: 1 mg/dL (ref 0.0–1.0)

## 2014-05-20 LAB — POCT PREGNANCY, URINE: Preg Test, Ur: NEGATIVE

## 2014-05-20 MED ORDER — NYSTATIN-TRIAMCINOLONE 100000-0.1 UNIT/GM-% EX OINT: 1.0000 "application " | TOPICAL_OINTMENT | Freq: Two times a day (BID) | CUTANEOUS | Status: DC

## 2014-05-20 NOTE — Discharge Instructions (Signed)

## 2014-05-20 NOTE — MAU Provider Note (Signed)
  History     CSN: [id number]  Arrival date and time: 05/20/14 1218   First Provider Initiated Contact with Patient 05/20/14 1353      Chief Complaint  Patient presents with  . Rash   HPI April Malone is 23 y.o. G1P1001 presents for musty odor in the creases of the thighs. She describes itching.   Used femine wipes and now it burns.  She denies vaginal discharge and odor.  1 sexual partner.      Past Medical History  Diagnosis Date  . [id number](478.2Headache(784.0)     Past Surgical History  Procedure Laterality Date  . Hernia repair      Family History  Problem Relation Age of Onset  . Other Neg Hx   . Sickle cell trait Sister   . Asthma Brother   . Allergies Brother     History  Substance Use Topics  . Smoking status: Never Smoker   . Smokeless tobacco: Not on file  . Alcohol Use: No    Allergies: No Known Allergies  No prescriptions prior to admission    Review of Systems  Constitutional: Negative for fever and chills.  Gastrointestinal: Negative for nausea, vomiting and abdominal pain.  Genitourinary:       Rash with odor in between thighs, neg for vaginal discharge and odor  Neurological: Negative for headaches.   Physical Exam   Blood pressure 112/71, pulse 100, temperature 98.8 F (37.1 C), temperature source Oral, resp. rate 16, height 5' 4.25" (1.632 m), weight 225 lb 12.8 oz (102.422 kg), SpO2 98.00%.  Physical Exam  Constitutional: She is oriented to person, place, and time. She appears well-developed and well-nourished. No distress.  HENT:  Head: Normocephalic.  Neck: Normal range of motion.  Cardiovascular: Normal rate.   Respiratory: Effort normal.  GI: Soft. She exhibits no distension and no mass. There is no tenderness. There is no rebound and no guarding.  Genitourinary: There is no rash, tenderness or lesion on the right labia. There is no rash, tenderness or lesion on the left labia. Cervix exhibits no motion tenderness, no discharge and no  friability. No erythema, tenderness or bleeding around the vagina. No foreign body around the vagina. Vaginal discharge (white discharge without discharge) found.  There are two areas that are dry, scaly between the labia majora and the thigh.  Neg for oozing, erythema.or lesions  Neurological: She is alert and oriented to person, place, and time.  Skin: Skin is warm and dry.    MAU Course  Procedures  MDM   Assessment and Plan  A:  Candidiasis or the inner thigh-bilaterally  P:  Rx for Mycolog Creme to apply 2 X day until clears       Instructed to keep area clean and dry.  Needham Biggins,EVE M 05/20/2014, 1:55 PM

## 2014-05-20 NOTE — MAU Provider Note (Signed)
Attestation of Attending Supervision of Advanced Practitioner (CNM/NP): Evaluation and management procedures were performed by the Advanced Practitioner under my supervision and collaboration.  I have reviewed the Advanced Practitioner's note and chart, and I agree with the management and plan.  HARRAWAY-SMITH, Shelonda Saxe 5:09 PM

## 2014-05-20 NOTE — MAU Note (Signed)
Patient states she has had a rash in the creases of her upper thighs for about one week. Used feminine wipes that made it worse. Denies pain or bleeding.

## 2014-06-17 ENCOUNTER — Ambulatory Visit: Payer: Medicaid Other | Admitting: Obstetrics & Gynecology

## 2014-06-17 ENCOUNTER — Ambulatory Visit (INDEPENDENT_AMBULATORY_CARE_PROVIDER_SITE_OTHER): Payer: Medicaid Other

## 2014-06-17 ENCOUNTER — Ambulatory Visit: Payer: Medicaid Other

## 2014-06-17 VITALS — BP 119/76 | HR 83 | Temp 98.4°F | Wt 225.3 lb

## 2014-06-17 DIAGNOSIS — Z3049 Encounter for surveillance of other contraceptives: Secondary | ICD-10-CM

## 2014-06-17 MED ORDER — MEDROXYPROGESTERONE ACETATE 104 MG/0.65ML ~~LOC~~ SUSP
104.0000 mg | Freq: Once | SUBCUTANEOUS | Status: AC
  Administered 2014-06-17: 104 mg via SUBCUTANEOUS

## 2014-06-17 NOTE — Progress Notes (Signed)
Patient here today for depo provera injection.  depo provera administered subcutaneously into left lower abdomen. Patient tolerated well. Denies any questions, concerns or problems. To return in between 11/22-12/7.

## 2014-08-19 ENCOUNTER — Encounter (HOSPITAL_COMMUNITY): Payer: Self-pay

## 2014-08-21 ENCOUNTER — Telehealth: Payer: Self-pay | Admitting: General Practice

## 2014-08-21 NOTE — Telephone Encounter (Signed)
Patient called and left message stating she has a question about the depo shot and would like a callback. Called patient, no answer- left message that we are trying to reach you to return your phone call, please call us back at the clinics

## 2014-08-23 NOTE — Telephone Encounter (Signed)
Called pt and left message that this is our second attempt in trying to reach you and if she continues to have any questions to please give our office a call.

## 2014-09-09 ENCOUNTER — Ambulatory Visit (INDEPENDENT_AMBULATORY_CARE_PROVIDER_SITE_OTHER): Payer: Medicaid Other | Admitting: *Deleted

## 2014-09-09 VITALS — BP 123/79 | HR 86

## 2014-09-09 DIAGNOSIS — B373 Candidiasis of vulva and vagina: Secondary | ICD-10-CM

## 2014-09-09 DIAGNOSIS — B3731 Acute candidiasis of vulva and vagina: Secondary | ICD-10-CM

## 2014-09-09 DIAGNOSIS — Z3042 Encounter for surveillance of injectable contraceptive: Secondary | ICD-10-CM

## 2014-09-09 MED ORDER — FLUCONAZOLE 150 MG PO TABS: 150.0000 mg | ORAL_TABLET | Freq: Once | ORAL | Status: DC

## 2014-09-09 MED ORDER — MEDROXYPROGESTERONE ACETATE 104 MG/0.65ML ~~LOC~~ SUSP
104.0000 mg | Freq: Once | SUBCUTANEOUS | Status: AC
  Administered 2014-09-09: 104 mg via SUBCUTANEOUS

## 2014-09-09 NOTE — Addendum Note (Signed)
Addended by: Sherre LainASH, AMANDA A on: 09/09/2014 10:41 AM   Modules accepted: Orders

## 2014-09-16 ENCOUNTER — Encounter (HOSPITAL_COMMUNITY): Payer: Self-pay

## 2014-09-16 ENCOUNTER — Inpatient Hospital Stay (HOSPITAL_COMMUNITY)
Admission: AD | Admit: 2014-09-16 | Discharge: 2014-09-16 | Disposition: A | Discharge status: Home / Self Care | Payer: BLUE CROSS/BLUE SHIELD | Source: Ambulatory Visit | Attending: Obstetrics & Gynecology | Admitting: Obstetrics & Gynecology

## 2014-09-16 DIAGNOSIS — N898 Other specified noninflammatory disorders of vagina: Secondary | ICD-10-CM | POA: Diagnosis not present

## 2014-09-16 DIAGNOSIS — N3941 Urge incontinence: Secondary | ICD-10-CM | POA: Insufficient documentation

## 2014-09-16 DIAGNOSIS — R300 Dysuria: Secondary | ICD-10-CM | POA: Insufficient documentation

## 2014-09-16 DIAGNOSIS — N390 Urinary tract infection, site not specified: Secondary | ICD-10-CM

## 2014-09-16 LAB — WET PREP, GENITAL
CLUE CELLS WET PREP: NONE SEEN
TRICH WET PREP: NONE SEEN
YEAST WET PREP: NONE SEEN

## 2014-09-16 LAB — URINALYSIS, ROUTINE W REFLEX MICROSCOPIC
Bilirubin Urine: NEGATIVE
GLUCOSE, UA: NEGATIVE mg/dL
Hgb urine dipstick: NEGATIVE
KETONES UR: NEGATIVE mg/dL
NITRITE: NEGATIVE
PROTEIN: NEGATIVE mg/dL
Specific Gravity, Urine: >1.03 — ABNORMAL HIGH (ref 1.005–1.030)
Urobilinogen, UA: 0.2 mg/dL (ref 0.0–1.0)
pH: 6 (ref 5.0–8.0)

## 2014-09-16 LAB — URINE MICROSCOPIC-ADD ON

## 2014-09-16 LAB — POCT PREGNANCY, URINE: Preg Test, Ur: NEGATIVE

## 2014-09-16 LAB — HIV ANTIBODY (ROUTINE TESTING W REFLEX): HIV 1&2 Ab, 4th Generation: NONREACTIVE

## 2014-09-16 MED ORDER — SULFAMETHOXAZOLE-TRIMETHOPRIM 800-160 MG PO TABS: 1.0000 | ORAL_TABLET | Freq: Two times a day (BID) | ORAL | Status: AC

## 2014-09-16 NOTE — MAU Note (Signed)
Patient states she has been having pain and pressure with urinating for about one week. States she has vaginal irritation but no discharge.

## 2014-09-16 NOTE — MAU Note (Signed)
Pt states due to work schedule has been unable to come for eval. [facility] and was given diflucan for probable yeast infection per pt's description of vaginal itching and thick white discharge. Discharge gone now and itching has ceased, however now having suprapubic pain and voids very little amounts of urine.

## 2014-09-16 NOTE — MAU Provider Note (Signed)
History     CSN: [id number]  Arrival date and time: 09/16/14 [id number]   First Provider Initiated Contact with Patient 09/16/14 1005      Chief Complaint  Patient presents with  . Dysuria   Dysuria  Associated symptoms include urgency. Pertinent negatives include no hematuria, nausea or vomiting.    Ms. April Malone is a 23 year old female presenting to the ED with complaints of burning while urinating, decreased urination, and abdominal discomfort in lower abdominal region. She states that the burning sensation while urinating and decreased while urination started 2 weeks ago. She states she went the clinic last Monday for her Depo shot, where she reported she had vaginal discharge and itching. She received diflucan and states the diflucan helped her vaginal discharge, but not the itching. She also notes that she has had some left lower abdominal discomfort for the past 4 days. She states the pain is intermittent and describes it as sharp. Her last sexual encounter was 5 days ago, and she states she used condoms.   Past Medical History  Diagnosis Date  . [id number](956.2Headache(784.0)     Past Surgical History  Procedure Laterality Date  . Hernia repair    . Wisdom tooth extraction      Family History  Problem Relation Age of Onset  . Other Neg Hx   . Sickle cell trait Sister   . Asthma Brother   . Allergies Brother     History  Substance Use Topics  . Smoking status: Never Smoker   . Smokeless tobacco: Never Used  . Alcohol Use: No    Allergies: No Known Allergies  Prescriptions prior to admission  Medication Sig Dispense Refill Last Dose  . fluconazole (DIFLUCAN) 150 MG tablet Take 1 tablet (150 mg total) by mouth once. (Patient not taking: Reported on 09/16/2014) 1 tablet 0   . nystatin-triamcinolone ointment (MYCOLOG) Apply 1 application topically 2 (two) times daily. (Patient not taking: Reported on 09/16/2014) 30 g 0    Results for orders placed or performed during the hospital  encounter of 09/16/14 (from the past 48 hour(s))  Urinalysis, Routine w reflex microscopic     Status: Abnormal   Collection Time: 09/16/14  8:20 AM  Result Value Ref Range   Color, Urine YELLOW YELLOW   APPearance HAZY (A) CLEAR   Specific Gravity, Urine >1.030 (H) 1.005 - 1.030   pH 6.0 5.0 - 8.0   Glucose, UA NEGATIVE NEGATIVE mg/dL   Hgb urine dipstick NEGATIVE NEGATIVE   Bilirubin Urine NEGATIVE NEGATIVE   Ketones, ur NEGATIVE NEGATIVE mg/dL   Protein, ur NEGATIVE NEGATIVE mg/dL   Urobilinogen, UA 0.2 0.0 - 1.0 mg/dL   Nitrite NEGATIVE NEGATIVE   Leukocytes, UA SMALL (A) NEGATIVE  Urine microscopic-add on     Status: Abnormal   Collection Time: 09/16/14  8:20 AM  Result Value Ref Range   Squamous Epithelial / LPF FEW (A) RARE   WBC, UA 0-2 <3 WBC/hpf   RBC / HPF 0-2 <3 RBC/hpf   Bacteria, UA FEW (A) RARE  Pregnancy, urine POC     Status: None   Collection Time: 09/16/14  8:48 AM  Result Value Ref Range   Preg Test, Ur NEGATIVE NEGATIVE    Comment:        THE SENSITIVITY OF THIS METHODOLOGY IS >24 mIU/mL   Wet prep, genital     Status: Abnormal   Collection Time: 09/16/14 10:25 AM  Result Value Ref Range   Yeast Wet  Prep HPF POC NONE SEEN NONE SEEN   Trich, Wet Prep NONE SEEN NONE SEEN   Clue Cells Wet Prep HPF POC NONE SEEN NONE SEEN   WBC, Wet Prep HPF POC FEW (A) NONE SEEN    Comment: MANY BACTERIA SEEN    Review of Systems  Constitutional: Negative for fever.  Respiratory: Negative for shortness of breath.   Cardiovascular: Negative for chest pain.  Gastrointestinal: Positive for abdominal pain. Negative for nausea and vomiting.       Left lower abdominal tenderness  Genitourinary: Positive for dysuria and urgency. Negative for hematuria.  Neurological: Negative for headaches.   Physical Exam   Blood pressure 141/83, pulse 102, temperature 98.3 F (36.8 C), temperature source Oral, resp. rate 16, height 5\' 5"  (1.651 m), weight 101.243 kg (223 lb 3.2  oz).  Physical Exam  Constitutional: She is oriented to person, place, and time. She appears well-developed and well-nourished.  HENT:  Head: Normocephalic.  Neck: Normal range of motion.  Cardiovascular: Normal rate and regular rhythm.   Respiratory: Effort normal and breath sounds normal. No respiratory distress.  GI: Soft. Bowel sounds are normal. She exhibits no distension. There is no rebound and no guarding.  Tenderness in LLQ and suprapubic region   Genitourinary: There is no rash or tenderness on the right labia. There is no rash or tenderness on the left labia. Vaginal discharge found.  Neurological: She is alert and oriented to person, place, and time.  Skin: Skin is warm and dry.    MAU Course  Procedures  None  MDM Pelvic exam- G/C and wet prep   Assessment and Plan  1. Dysuria and urgency  - Will treat as UTI and give abx.              - Bactrim 500 mg BID for 3 days              - Urine culture pending   Karma LewFong, Chelsea K 09/16/2014, 10:14 AM   Evaluation and management procedures were performed by the PA student under my supervision and collaboration. I have reviewed the note and chart, and I agree with the management and plan.  Iona HansenJennifer Irene Rasch, NP 09/16/2014 2:59 PM

## 2014-09-17 ENCOUNTER — Encounter (HOSPITAL_COMMUNITY): Payer: Self-pay | Admitting: Emergency Medicine

## 2014-09-17 ENCOUNTER — Emergency Department (HOSPITAL_COMMUNITY)
Admission: EM | Admit: 2014-09-17 | Discharge: 2014-09-17 | Disposition: A | Discharge status: Home / Self Care | Payer: BC Managed Care – PPO | Attending: Emergency Medicine | Admitting: Emergency Medicine

## 2014-09-17 DIAGNOSIS — Y9289 Other specified places as the place of occurrence of the external cause: Secondary | ICD-10-CM | POA: Insufficient documentation

## 2014-09-17 DIAGNOSIS — Z792 Long term (current) use of antibiotics: Secondary | ICD-10-CM | POA: Diagnosis not present

## 2014-09-17 DIAGNOSIS — K002 Abnormalities of size and form of teeth: Secondary | ICD-10-CM | POA: Insufficient documentation

## 2014-09-17 DIAGNOSIS — Z98811 Dental restoration status: Secondary | ICD-10-CM | POA: Insufficient documentation

## 2014-09-17 DIAGNOSIS — S0993XA Unspecified injury of face, initial encounter: Secondary | ICD-10-CM | POA: Diagnosis present

## 2014-09-17 DIAGNOSIS — Y9389 Activity, other specified: Secondary | ICD-10-CM | POA: Insufficient documentation

## 2014-09-17 DIAGNOSIS — K0889 Other specified disorders of teeth and supporting structures: Secondary | ICD-10-CM

## 2014-09-17 DIAGNOSIS — X58XXXA Exposure to other specified factors, initial encounter: Secondary | ICD-10-CM | POA: Diagnosis not present

## 2014-09-17 DIAGNOSIS — Y998 Other external cause status: Secondary | ICD-10-CM | POA: Insufficient documentation

## 2014-09-17 DIAGNOSIS — S025XXA Fracture of tooth (traumatic), initial encounter for closed fracture: Secondary | ICD-10-CM | POA: Diagnosis not present

## 2014-09-17 LAB — URINE CULTURE
COLONY COUNT: NO GROWTH
CULTURE: NO GROWTH
Special Requests: NORMAL

## 2014-09-17 LAB — GC/CHLAMYDIA PROBE AMP
CT Probe RNA: NEGATIVE
GC Probe RNA: NEGATIVE

## 2014-09-17 NOTE — Discharge Instructions (Signed)

## 2014-09-17 NOTE — ED Notes (Signed)
PA at bedside.

## 2014-09-17 NOTE — ED Notes (Signed)
Pt states she got a crown put on her tooth at the age 23 and yesterday she bit down the wrong way and dislodged to crown  Pt states it is very painful

## 2014-09-17 NOTE — ED Provider Notes (Signed)
CSN: [id number]     Arrival date & time 09/17/14  [id number] History   First MD Initiated Contact with Patient 09/17/14 [phone]     Chief Complaint  Patient presents with  . Dental Pain     (Consider location/radiation/quality/duration/timing/severity/associated sxs/prior Treatment) HPI  Pt is a 23yo female with hx of headaches, presenting to ED with c/o front upper tooth pain that started last night after pt bit down wrong while eating meatballs.  States she had a crown put on the tooth when she was 23 years old, has not had problems with it until last night.  Pain is constant, throbbing, 10/10.  No pain medication PTA as pt is afraid her tooth is going to come out it is so loose. Denies fever, nausea or vomiting. No difficulty breathing.  Dentist: none due to lack of insurance.   Past Medical History  Diagnosis Date  . GEXBMWUX(324.4Headache(784.0)    Past Surgical History  Procedure Laterality Date  . Hernia repair    . Wisdom tooth extraction     Family History  Problem Relation Age of Onset  . Other Neg Hx   . Sickle cell trait Sister   . Asthma Brother   . Allergies Brother    History  Substance Use Topics  . Smoking status: Never Smoker   . Smokeless tobacco: Never Used  . Alcohol Use: No   OB History    Gravida Para Term Preterm AB TAB SAB Ectopic Multiple Living   1 1 1       1      Review of Systems  Constitutional: Negative for fever and chills.  HENT: Positive for dental problem.   All other systems reviewed and are negative.     Allergies  Review of patient's allergies indicates no known allergies.  Home Medications   Prior to Admission medications   Medication Sig Start Date End Date Taking? Authorizing Provider  sulfamethoxazole-trimethoprim (BACTRIM DS,SEPTRA DS) 800-160 MG per tablet Take 1 tablet by mouth 2 (two) times daily. 09/16/14 09/23/14 Yes Iona HansenJennifer Irene Rasch, NP   BP 129/68 mmHg  Pulse 96  Temp(Src) 98.1 F (36.7 C) (Oral)  Resp 16  SpO2  98% Physical Exam  Constitutional: She is oriented to person, place, and time. She appears well-developed and well-nourished.  HENT:  Head: Normocephalic and atraumatic.  Mouth/Throat: Uvula is midline, oropharynx is clear and moist and mucous membranes are normal. No trismus in the jaw. Abnormal dentition. No dental abscesses, uvula swelling or dental caries.    Tooth #9, coming loose at gingiva, tender to touch. No active bleeding or discharge. No evidence of underlying abscess.   Eyes: EOM are normal.  Neck: Normal range of motion.  Cardiovascular: Normal rate.   Pulmonary/Chest: Effort normal.  Musculoskeletal: Normal range of motion.  Neurological: She is alert and oriented to person, place, and time.  Skin: Skin is warm and dry.  Psychiatric: She has a normal mood and affect. Her behavior is normal.  Nursing note and vitals reviewed.   ED Course  Procedures (including critical care time) Labs Review Labs Reviewed - No data to display  Imaging Review No results found.   EKG Interpretation None      MDM   Final diagnoses:  Pain, dental  S/p dental crown  Dental injury, initial encounter    Pt is a 23yo female c/o front upper tooth pain after biting down "wrong" on dinner last night. Tooth has become loose and painful.  On exam, tooth  is loose along the gumline. No active bleeding or discharge. Tender to touch.  No evidence of underlying abscess. Pain c/w dental trauma/injury.  8:04AM consulted with secretary at Dr. Leanord AsalFarless, DDS, office.  Pt will be able to be worked into the schedule this morning to be seen. Pt may be discharged from ED to report directly to Dr. Lenna SciaraFarless's office for further evaluation and treatment.   Discussed plan with pt, provided directions and phone number. Pt verbalized understanding and agreement with tx plan.    Junius Finnerrin O'Malley, PA-C 09/17/14 [id number]  Doug SouSam Jacubowitz, MD 09/17/14 [id number]

## 2014-11-20 ENCOUNTER — Emergency Department (HOSPITAL_COMMUNITY)
Admission: EM | Admit: 2014-11-20 | Discharge: 2014-11-20 | Disposition: A | Discharge status: Home / Self Care | Payer: Medicaid Other | Attending: Emergency Medicine | Admitting: Emergency Medicine

## 2014-11-20 ENCOUNTER — Encounter (HOSPITAL_COMMUNITY): Payer: Self-pay

## 2014-11-20 DIAGNOSIS — G542 Cervical root disorders, not elsewhere classified: Secondary | ICD-10-CM

## 2014-11-20 DIAGNOSIS — M25511 Pain in right shoulder: Secondary | ICD-10-CM | POA: Diagnosis present

## 2014-11-20 MED ORDER — CYCLOBENZAPRINE HCL 10 MG PO TABS: 10.0000 mg | ORAL_TABLET | Freq: Two times a day (BID) | ORAL | Status: DC | PRN

## 2014-11-20 MED ORDER — NAPROXEN 500 MG PO TABS
500.0000 mg | ORAL_TABLET | Freq: Once | ORAL | Status: AC
Start: 2014-11-20 — End: 2014-11-20
  Administered 2014-11-20: 500 mg via ORAL
  Filled 2014-11-20: qty 1

## 2014-11-20 MED ORDER — NAPROXEN 500 MG PO TABS: 500.0000 mg | ORAL_TABLET | Freq: Two times a day (BID) | ORAL | Status: DC

## 2014-11-20 NOTE — ED Notes (Signed)
Pt states she does repetitive motion at work with right arm.  Has had soreness starting about a week ago.  Today noticed numbness to rt arm.

## 2014-11-20 NOTE — ED Provider Notes (Signed)
CSN: [id number]     Arrival date & time 11/20/14  0902 History   First MD Initiated Contact with Patient 11/20/14 0920     Chief Complaint  Patient presents with  . Shoulder Pain  . Hand Numbness    HPI The patient presents to the emergency room with complaints of pain in her right shoulder and arm that radiates distally. 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 She has a repetitive activity with her right shoulder and right arm. Over the last week she has noticed some soreness in her right arm.  Soreness is from her trapezius area down towards her mid arm. Movement and activity has been exacerbating the pain. Today however she suddenly felt numbness in her right arm down to her hand. She had some tingling and she also felt like her grip strength was decreased. This concerned her so she decided to come to the emergency room. Denies any trouble with speech she denies any numbness or weakness in her legs. The symptoms are actually improving and not as bad as they were earlier. Past Medical History  Diagnosis Date  . [id number](478.2Headache(784.0)    Past Surgical History  Procedure Laterality Date  . Hernia repair    . Wisdom tooth extraction     Family History  Problem Relation Age of Onset  . Other Neg Hx   . Sickle cell trait Sister   . Asthma Brother   . Allergies Brother    History  Substance Use Topics  . Smoking status: Never Smoker   . Smokeless tobacco: Never Used  . Alcohol Use: No   OB History    Gravida Para Term Preterm AB TAB SAB Ectopic Multiple Living   1 1 1       1      Review of Systems  All other systems reviewed and are negative.     Allergies  Review of patient's allergies indicates no known allergies.  Home Medications   Prior to Admission medications   Not on File   BP 129/72 mmHg  Pulse 107  Temp(Src) 98.7 F (37.1 C) (Oral)  Resp 20  SpO2 100% Physical Exam  Constitutional: She is oriented to person, place, and time. She appears well-developed  and well-nourished. No distress.  HENT:  Head: Normocephalic and atraumatic.  Right Ear: External ear normal.  Left Ear: External ear normal.  Mouth/Throat: Oropharynx is clear and moist.  Eyes: Conjunctivae are normal. Right eye exhibits no discharge. Left eye exhibits no discharge. No scleral icterus.  Neck: Neck supple. No tracheal deviation present.  Cardiovascular: Normal rate, regular rhythm and intact distal pulses.   Pulmonary/Chest: Effort normal and breath sounds normal. No stridor. No respiratory distress. She has no wheezes. She has no rales.  Abdominal: Soft. Bowel sounds are normal. She exhibits no distension. There is no tenderness. There is no rebound and no guarding.  Musculoskeletal: She exhibits no edema or tenderness.       Right shoulder: She exhibits decreased range of motion. She exhibits no tenderness and no bony tenderness.       Arms: ttp right trapezius muscle,   Neurological: She is alert and oriented to person, place, and time. She has normal strength. No cranial nerve deficit (No facial droop, extraocular movements intact, tongue midline ) or sensory deficit. She exhibits normal muscle tone. She displays no seizure activity. Coordination normal.  5/5 strength grip bilaterally and 5/5 strength bilateral hip extension, sensation intact in all extremities, no visual  field cuts, no left or right sided neglect, normal finger-nose exam bilaterally, no nystagmus noted   Skin: Skin is warm and dry. No rash noted.  Psychiatric: She has a normal mood and affect.  Nursing note and vitals reviewed.   ED Course  Procedures (including critical care time) Labs Review Labs Reviewed - No data to display  Imaging Review No results found.   EKG Interpretation None      MDM   Final diagnoses:  Cervical nerve root impingement    Pt's symptoms suggestive of cervical radiculopathy.  Could be related to a peripheral nerve impingement as well.  Doubt stroke.  Exam  reassuring.  Dc home with pain meds, muscle relaxant.  Recommend follow up with workers comp as it could be a job related injury.    Linwood Dibbles, MD 11/20/14 [phone]

## 2014-11-20 NOTE — Discharge Instructions (Signed)

## 2014-12-02 ENCOUNTER — Ambulatory Visit: Payer: Medicaid Other

## 2014-12-09 ENCOUNTER — Encounter: Payer: Self-pay | Admitting: Obstetrics & Gynecology

## 2014-12-09 ENCOUNTER — Ambulatory Visit (INDEPENDENT_AMBULATORY_CARE_PROVIDER_SITE_OTHER): Payer: Medicaid Other

## 2014-12-09 VITALS — BP 129/77 | HR 110 | Temp 98.6°F | Ht 65.0 in | Wt 227.1 lb

## 2014-12-09 DIAGNOSIS — Z3042 Encounter for surveillance of injectable contraceptive: Secondary | ICD-10-CM

## 2014-12-09 MED ORDER — MEDROXYPROGESTERONE ACETATE 104 MG/0.65ML ~~LOC~~ SUSP
104.0000 mg | Freq: Once | SUBCUTANEOUS | Status: AC
  Administered 2014-12-09: 104 mg via SUBCUTANEOUS

## 2014-12-09 NOTE — Progress Notes (Signed)
Patient here today for depo provera 104mg  injection within the appropriate time frame. 104mg  depo administered subcutaneously into RLQ abdomen. Patient tolerated well. No questions, concerns or problems to report.

## 2015-02-05 ENCOUNTER — Telehealth: Payer: Self-pay | Admitting: *Deleted

## 2015-02-05 NOTE — Telephone Encounter (Signed)
April Malone left a message stating she has some trouble with vaginal discharge and smell. April Malone and she states she didn't mean to say vaginal discharge. She states she is having the same problem she always has in summer - where she gets a smell like when you don't wear deodorant / kind of  Musty smell from the crease at top of thigh.  States she spoke to a nurse last year and they told her to keep it clean and wash the area several times a day which she does. States there is not a discharge.  Informed her I would need to talk to a provider and call her back in the next day or two.

## 2015-02-07 NOTE — Telephone Encounter (Signed)
Talked with Nada MaclachlanKaren Teague-Clark , PA and she reccomended patient try over the counter Ziasorb- called patient and instructed her she may try this and follow package instructions- we think she may have skin yeast.  She will follow up at her yearly visit. In May.

## 2015-02-26 ENCOUNTER — Encounter: Payer: Self-pay | Admitting: Obstetrics & Gynecology

## 2015-02-26 ENCOUNTER — Ambulatory Visit (INDEPENDENT_AMBULATORY_CARE_PROVIDER_SITE_OTHER): Payer: Medicaid Other | Admitting: Obstetrics & Gynecology

## 2015-02-26 ENCOUNTER — Other Ambulatory Visit (HOSPITAL_COMMUNITY)
Admission: RE | Admit: 2015-02-26 | Discharge: 2015-02-26 | Disposition: A | Discharge status: Home / Self Care | Payer: Medicaid Other | Source: Ambulatory Visit | Attending: Obstetrics & Gynecology | Admitting: Obstetrics & Gynecology

## 2015-02-26 VITALS — BP 130/57 | HR 87 | Temp 98.8°F | Wt 226.1 lb

## 2015-02-26 DIAGNOSIS — Z124 Encounter for screening for malignant neoplasm of cervix: Secondary | ICD-10-CM

## 2015-02-26 DIAGNOSIS — Z0000 Encounter for general adult medical examination without abnormal findings: Secondary | ICD-10-CM | POA: Diagnosis not present

## 2015-02-26 DIAGNOSIS — Z3009 Encounter for other general counseling and advice on contraception: Secondary | ICD-10-CM | POA: Diagnosis present

## 2015-02-26 DIAGNOSIS — Z01419 Encounter for gynecological examination (general) (routine) without abnormal findings: Secondary | ICD-10-CM | POA: Diagnosis not present

## 2015-02-26 NOTE — Patient Instructions (Signed)
Preventive Care for Adults A healthy lifestyle and preventive care can promote health and wellness. Preventive health guidelines for women include the following key practices.  A routine yearly physical is a good way to check with your health care provider about your health and preventive screening. It is a chance to share any concerns and updates on your health and to receive a thorough exam.  Visit your dentist for a routine exam and preventive care every 6 months. Brush your teeth twice a day and floss once a day. Good oral hygiene prevents tooth decay and gum disease.  The frequency of eye exams is based on your age, health, family medical history, use of contact lenses, and other factors. Follow your health care provider's recommendations for frequency of eye exams.  Eat a healthy diet. Foods like vegetables, fruits, whole grains, low-fat dairy products, and lean protein foods contain the nutrients you need without too many calories. Decrease your intake of foods high in solid fats, added sugars, and salt. Eat the right amount of calories for you.Get information about a proper diet from your health care provider, if necessary.  Regular physical exercise is one of the most important things you can do for your health. Most adults should get at least 150 minutes of moderate-intensity exercise (any activity that increases your heart rate and causes you to sweat) each week. In addition, most adults need muscle-strengthening exercises on 2 or more days a week.  Maintain a healthy weight. The body mass index (BMI) is a screening tool to identify possible weight problems. It provides an estimate of body fat based on height and weight. Your health care provider can find your BMI and can help you achieve or maintain a healthy weight.For adults 20 years and older:  A BMI below 18.5 is considered underweight.  A BMI of 18.5 to 24.9 is normal.  A BMI of 25 to 29.9 is considered overweight.  A BMI of  30 and above is considered obese.  Maintain normal blood lipids and cholesterol levels by exercising and minimizing your intake of saturated fat. Eat a balanced diet with plenty of fruit and vegetables. Blood tests for lipids and cholesterol should begin at age 76 and be repeated every 5 years. If your lipid or cholesterol levels are high, you are over 50, or you are at high risk for heart disease, you may need your cholesterol levels checked more frequently.Ongoing high lipid and cholesterol levels should be treated with medicines if diet and exercise are not working.  If you smoke, find out from your health care provider how to quit. If you do not use tobacco, do not start.  Lung cancer screening is recommended for adults aged 22-80 years who are at high risk for developing lung cancer because of a history of smoking. A yearly low-dose CT scan of the lungs is recommended for people who have at least a 30-pack-year history of smoking and are a current smoker or have quit within the past 15 years. A pack year of smoking is smoking an average of 1 pack of cigarettes a day for 1 year (for example: 1 pack a day for 30 years or 2 packs a day for 15 years). Yearly screening should continue until the smoker has stopped smoking for at least 15 years. Yearly screening should be stopped for people who develop a health problem that would prevent them from having lung cancer treatment.  If you are pregnant, do not drink alcohol. If you are breastfeeding,  be very cautious about drinking alcohol. If you are not pregnant and choose to drink alcohol, do not have more than 1 drink per day. One drink is considered to be 12 ounces (355 mL) of beer, 5 ounces (148 mL) of wine, or 1.5 ounces (44 mL) of liquor.  Avoid use of street drugs. Do not share needles with anyone. Ask for help if you need support or instructions about stopping the use of drugs.  High blood pressure causes heart disease and increases the risk of  stroke. Your blood pressure should be checked at least every 1 to 2 years. Ongoing high blood pressure should be treated with medicines if weight loss and exercise do not work.  If you are 75-52 years old, ask your health care provider if you should take aspirin to prevent strokes.  Diabetes screening involves taking a blood sample to check your fasting blood sugar level. This should be done once every 3 years, after age 15, if you are within normal weight and without risk factors for diabetes. Testing should be considered at a younger age or be carried out more frequently if you are overweight and have at least 1 risk factor for diabetes.  Breast cancer screening is essential preventive care for women. You should practice "breast self-awareness." This means understanding the normal appearance and feel of your breasts and may include breast self-examination. Any changes detected, no matter how small, should be reported to a health care provider. Women in their 58s and 30s should have a clinical breast exam (CBE) by a health care provider as part of a regular health exam every 1 to 3 years. After age 16, women should have a CBE every year. Starting at age 53, women should consider having a mammogram (breast X-ray test) every year. Women who have a family history of breast cancer should talk to their health care provider about genetic screening. Women at a high risk of breast cancer should talk to their health care providers about having an MRI and a mammogram every year.  Breast cancer gene (BRCA)-related cancer risk assessment is recommended for women who have family members with BRCA-related cancers. BRCA-related cancers include breast, ovarian, tubal, and peritoneal cancers. Having family members with these cancers may be associated with an increased risk for harmful changes (mutations) in the breast cancer genes BRCA1 and BRCA2. Results of the assessment will determine the need for genetic counseling and  BRCA1 and BRCA2 testing.  Routine pelvic exams to screen for cancer are no longer recommended for nonpregnant women who are considered low risk for cancer of the pelvic organs (ovaries, uterus, and vagina) and who do not have symptoms. Ask your health care provider if a screening pelvic exam is right for you.  If you have had past treatment for cervical cancer or a condition that could lead to cancer, you need Pap tests and screening for cancer for at least 20 years after your treatment. If Pap tests have been discontinued, your risk factors (such as having a new sexual partner) need to be reassessed to determine if screening should be resumed. Some women have medical problems that increase the chance of getting cervical cancer. In these cases, your health care provider may recommend more frequent screening and Pap tests.  The HPV test is an additional test that may be used for cervical cancer screening. The HPV test looks for the virus that can cause the cell changes on the cervix. The cells collected during the Pap test can be  tested for HPV. The HPV test could be used to screen women aged 30 years and older, and should be used in women of any age who have unclear Pap test results. After the age of 30, women should have HPV testing at the same frequency as a Pap test.  Colorectal cancer can be detected and often prevented. Most routine colorectal cancer screening begins at the age of 50 years and continues through age 75 years. However, your health care provider may recommend screening at an earlier age if you have risk factors for colon cancer. On a yearly basis, your health care provider may provide home test kits to check for hidden blood in the stool. Use of a small camera at the end of a tube, to directly examine the colon (sigmoidoscopy or colonoscopy), can detect the earliest forms of colorectal cancer. Talk to your health care provider about this at age 50, when routine screening begins. Direct  exam of the colon should be repeated every 5-10 years through age 75 years, unless early forms of pre-cancerous polyps or small growths are found.  People who are at an increased risk for hepatitis B should be screened for this virus. You are considered at high risk for hepatitis B if:  You were born in a country where hepatitis B occurs often. Talk with your health care provider about which countries are considered high risk.  Your parents were born in a high-risk country and you have not received a shot to protect against hepatitis B (hepatitis B vaccine).  You have HIV or AIDS.  You use needles to inject street drugs.  You live with, or have sex with, someone who has hepatitis B.  You get hemodialysis treatment.  You take certain medicines for conditions like cancer, organ transplantation, and autoimmune conditions.  Hepatitis C blood testing is recommended for all people born from 1945 through 1965 and any individual with known risks for hepatitis C.  Practice safe sex. Use condoms and avoid high-risk sexual practices to reduce the spread of sexually transmitted infections (STIs). STIs include gonorrhea, chlamydia, syphilis, trichomonas, herpes, HPV, and human immunodeficiency virus (HIV). Herpes, HIV, and HPV are viral illnesses that have no cure. They can result in disability, cancer, and death.  You should be screened for sexually transmitted illnesses (STIs) including gonorrhea and chlamydia if:  You are sexually active and are younger than 24 years.  You are older than 24 years and your health care provider tells you that you are at risk for this type of infection.  Your sexual activity has changed since you were last screened and you are at an increased risk for chlamydia or gonorrhea. Ask your health care provider if you are at risk.  If you are at risk of being infected with HIV, it is recommended that you take a prescription medicine daily to prevent HIV infection. This is  called preexposure prophylaxis (PrEP). You are considered at risk if:  You are a heterosexual woman, are sexually active, and are at increased risk for HIV infection.  You take drugs by injection.  You are sexually active with a partner who has HIV.  Talk with your health care provider about whether you are at high risk of being infected with HIV. If you choose to begin PrEP, you should first be tested for HIV. You should then be tested every 3 months for as long as you are taking PrEP.  Osteoporosis is a disease in which the bones lose minerals and strength   with aging. This can result in serious bone fractures or breaks. The risk of osteoporosis can be identified using a bone density scan. Women ages 65 years and over and women at risk for fractures or osteoporosis should discuss screening with their health care providers. Ask your health care provider whether you should take a calcium supplement or vitamin D to reduce the rate of osteoporosis.  Menopause can be associated with physical symptoms and risks. Hormone replacement therapy is available to decrease symptoms and risks. You should talk to your health care provider about whether hormone replacement therapy is right for you.  Use sunscreen. Apply sunscreen liberally and repeatedly throughout the day. You should seek shade when your shadow is shorter than you. Protect yourself by wearing long sleeves, pants, a wide-brimmed hat, and sunglasses year round, whenever you are outdoors.  Once a month, do a whole body skin exam, using a mirror to look at the skin on your back. Tell your health care provider of new moles, moles that have irregular borders, moles that are larger than a pencil eraser, or moles that have changed in shape or color.  Stay current with required vaccines (immunizations).  Influenza vaccine. All adults should be immunized every year.  Tetanus, diphtheria, and acellular pertussis (Td, Tdap) vaccine. Pregnant women should  receive 1 dose of Tdap vaccine during each pregnancy. The dose should be obtained regardless of the length of time since the last dose. Immunization is preferred during the 27th-36th week of gestation. An adult who has not previously received Tdap or who does not know her vaccine status should receive 1 dose of Tdap. This initial dose should be followed by tetanus and diphtheria toxoids (Td) booster doses every 10 years. Adults with an unknown or incomplete history of completing a 3-dose immunization series with Td-containing vaccines should begin or complete a primary immunization series including a Tdap dose. Adults should receive a Td booster every 10 years.  Varicella vaccine. An adult without evidence of immunity to varicella should receive 2 doses or a second dose if she has previously received 1 dose. Pregnant females who do not have evidence of immunity should receive the first dose after pregnancy. This first dose should be obtained before leaving the health care facility. The second dose should be obtained 4-8 weeks after the first dose.  Human papillomavirus (HPV) vaccine. Females aged 13-26 years who have not received the vaccine previously should obtain the 3-dose series. The vaccine is not recommended for use in pregnant females. However, pregnancy testing is not needed before receiving a dose. If a female is found to be pregnant after receiving a dose, no treatment is needed. In that case, the remaining doses should be delayed until after the pregnancy. Immunization is recommended for any person with an immunocompromised condition through the age of 26 years if she did not get any or all doses earlier. During the 3-dose series, the second dose should be obtained 4-8 weeks after the first dose. The third dose should be obtained 24 weeks after the first dose and 16 weeks after the second dose.  Zoster vaccine. One dose is recommended for adults aged 60 years or older unless certain conditions are  present.  Measles, mumps, and rubella (MMR) vaccine. Adults born before 1957 generally are considered immune to measles and mumps. Adults born in 1957 or later should have 1 or more doses of MMR vaccine unless there is a contraindication to the vaccine or there is laboratory evidence of immunity to   each of the three diseases. A routine second dose of MMR vaccine should be obtained at least 28 days after the first dose for students attending postsecondary schools, health care workers, or international travelers. People who received inactivated measles vaccine or an unknown type of measles vaccine during 1963-1967 should receive 2 doses of MMR vaccine. People who received inactivated mumps vaccine or an unknown type of mumps vaccine before 1979 and are at high risk for mumps infection should consider immunization with 2 doses of MMR vaccine. For females of childbearing age, rubella immunity should be determined. If there is no evidence of immunity, females who are not pregnant should be vaccinated. If there is no evidence of immunity, females who are pregnant should delay immunization until after pregnancy. Unvaccinated health care workers born before 1957 who lack laboratory evidence of measles, mumps, or rubella immunity or laboratory confirmation of disease should consider measles and mumps immunization with 2 doses of MMR vaccine or rubella immunization with 1 dose of MMR vaccine.  Pneumococcal 13-valent conjugate (PCV13) vaccine. When indicated, a person who is uncertain of her immunization history and has no record of immunization should receive the PCV13 vaccine. An adult aged 19 years or older who has certain medical conditions and has not been previously immunized should receive 1 dose of PCV13 vaccine. This PCV13 should be followed with a dose of pneumococcal polysaccharide (PPSV23) vaccine. The PPSV23 vaccine dose should be obtained at least 8 weeks after the dose of PCV13 vaccine. An adult aged 19  years or older who has certain medical conditions and previously received 1 or more doses of PPSV23 vaccine should receive 1 dose of PCV13. The PCV13 vaccine dose should be obtained 1 or more years after the last PPSV23 vaccine dose.  Pneumococcal polysaccharide (PPSV23) vaccine. When PCV13 is also indicated, PCV13 should be obtained first. All adults aged 65 years and older should be immunized. An adult younger than age 65 years who has certain medical conditions should be immunized. Any person who resides in a nursing home or long-term care facility should be immunized. An adult smoker should be immunized. People with an immunocompromised condition and certain other conditions should receive both PCV13 and PPSV23 vaccines. People with human immunodeficiency virus (HIV) infection should be immunized as soon as possible after diagnosis. Immunization during chemotherapy or radiation therapy should be avoided. Routine use of PPSV23 vaccine is not recommended for American Indians, [place] Natives, or people younger than 65 years unless there are medical conditions that require PPSV23 vaccine. When indicated, people who have unknown immunization and have no record of immunization should receive PPSV23 vaccine. One-time revaccination 5 years after the first dose of PPSV23 is recommended for people aged 19-64 years who have chronic kidney failure, nephrotic syndrome, asplenia, or immunocompromised conditions. People who received 1-2 doses of PPSV23 before age 65 years should receive another dose of PPSV23 vaccine at age 65 years or later if at least 5 years have passed since the previous dose. Doses of PPSV23 are not needed for people immunized with PPSV23 at or after age 65 years.  Meningococcal vaccine. Adults with asplenia or persistent complement component deficiencies should receive 2 doses of quadrivalent meningococcal conjugate (MenACWY-D) vaccine. The doses should be obtained at least 2 months apart.  Microbiologists working with certain meningococcal bacteria, military recruits, people at risk during an outbreak, and people who travel to or live in countries with a high rate of meningitis should be immunized. A first-year college student up through age   21 years who is living in a residence hall should receive a dose if she did not receive a dose on or after her 16th birthday. Adults who have certain high-risk conditions should receive one or more doses of vaccine.  Hepatitis A vaccine. Adults who wish to be protected from this disease, have certain high-risk conditions, work with hepatitis A-infected animals, work in hepatitis A research labs, or travel to or work in countries with a high rate of hepatitis A should be immunized. Adults who were previously unvaccinated and who anticipate close contact with an international adoptee during the first 60 days after arrival in the [place] from a country with a high rate of hepatitis A should be immunized.  Hepatitis B vaccine. Adults who wish to be protected from this disease, have certain high-risk conditions, may be exposed to blood or other infectious body fluids, are household contacts or sex partners of hepatitis B positive people, are clients or workers in certain care facilities, or travel to or work in countries with a high rate of hepatitis B should be immunized.  Haemophilus influenzae type b (Hib) vaccine. A previously unvaccinated person with asplenia or sickle cell disease or having a scheduled splenectomy should receive 1 dose of Hib vaccine. Regardless of previous immunization, a recipient of a hematopoietic stem cell transplant should receive a 3-dose series 6-12 months after her successful transplant. Hib vaccine is not recommended for adults with HIV infection. Preventive Services / Frequency Ages 64 to 68 years  Blood pressure check.** / Every 1 to 2 years.  Lipid and cholesterol check.** / Every 5 years beginning at age  22.  Clinical breast exam.** / Every 3 years for women in their 88s and 53s.  BRCA-related cancer risk assessment.** / For women who have family members with a BRCA-related cancer (breast, ovarian, tubal, or peritoneal cancers).  Pap test.** / Every 2 years from ages 90 through 51. Every 3 years starting at age 21 through age 56 or 3 with a history of 3 consecutive normal Pap tests.  HPV screening.** / Every 3 years from ages 24 through ages 1 to 46 with a history of 3 consecutive normal Pap tests.  Hepatitis C blood test.** / For any individual with known risks for hepatitis C.  Skin self-exam. / Monthly.  Influenza vaccine. / Every year.  Tetanus, diphtheria, and acellular pertussis (Tdap, Td) vaccine.** / Consult your health care provider. Pregnant women should receive 1 dose of Tdap vaccine during each pregnancy. 1 dose of Td every 10 years.  Varicella vaccine.** / Consult your health care provider. Pregnant females who do not have evidence of immunity should receive the first dose after pregnancy.  HPV vaccine. / 3 doses over 6 months, if 72 and younger. The vaccine is not recommended for use in pregnant females. However, pregnancy testing is not needed before receiving a dose.  Measles, mumps, rubella (MMR) vaccine.** / You need at least 1 dose of MMR if you were born in 1957 or later. You may also need a 2nd dose. For females of childbearing age, rubella immunity should be determined. If there is no evidence of immunity, females who are not pregnant should be vaccinated. If there is no evidence of immunity, females who are pregnant should delay immunization until after pregnancy.  Pneumococcal 13-valent conjugate (PCV13) vaccine.** / Consult your health care provider.  Pneumococcal polysaccharide (PPSV23) vaccine.** / 1 to 2 doses if you smoke cigarettes or if you have certain conditions.  Meningococcal vaccine.** /  1 dose if you are age 19 to 21 years and a first-year college  student living in a residence hall, or have one of several medical conditions, you need to get vaccinated against meningococcal disease. You may also need additional booster doses.  Hepatitis A vaccine.** / Consult your health care provider.  Hepatitis B vaccine.** / Consult your health care provider.  Haemophilus influenzae type b (Hib) vaccine.** / Consult your health care provider. Ages 40 to 64 years  Blood pressure check.** / Every 1 to 2 years.  Lipid and cholesterol check.** / Every 5 years beginning at age 20 years.  Lung cancer screening. / Every year if you are aged 55-80 years and have a 30-pack-year history of smoking and currently smoke or have quit within the past 15 years. Yearly screening is stopped once you have quit smoking for at least 15 years or develop a health problem that would prevent you from having lung cancer treatment.  Clinical breast exam.** / Every year after age 40 years.  BRCA-related cancer risk assessment.** / For women who have family members with a BRCA-related cancer (breast, ovarian, tubal, or peritoneal cancers).  Mammogram.** / Every year beginning at age 40 years and continuing for as long as you are in good health. Consult with your health care provider.  Pap test.** / Every 3 years starting at age 30 years through age 65 or 70 years with a history of 3 consecutive normal Pap tests.  HPV screening.** / Every 3 years from ages 30 years through ages 65 to 70 years with a history of 3 consecutive normal Pap tests.  Fecal occult blood test (FOBT) of stool. / Every year beginning at age 50 years and continuing until age 75 years. You may not need to do this test if you get a colonoscopy every 10 years.  Flexible sigmoidoscopy or colonoscopy.** / Every 5 years for a flexible sigmoidoscopy or every 10 years for a colonoscopy beginning at age 50 years and continuing until age 75 years.  Hepatitis C blood test.** / For all people born from 1945 through  1965 and any individual with known risks for hepatitis C.  Skin self-exam. / Monthly.  Influenza vaccine. / Every year.  Tetanus, diphtheria, and acellular pertussis (Tdap/Td) vaccine.** / Consult your health care provider. Pregnant women should receive 1 dose of Tdap vaccine during each pregnancy. 1 dose of Td every 10 years.  Varicella vaccine.** / Consult your health care provider. Pregnant females who do not have evidence of immunity should receive the first dose after pregnancy.  Zoster vaccine.** / 1 dose for adults aged 60 years or older.  Measles, mumps, rubella (MMR) vaccine.** / You need at least 1 dose of MMR if you were born in 1957 or later. You may also need a 2nd dose. For females of childbearing age, rubella immunity should be determined. If there is no evidence of immunity, females who are not pregnant should be vaccinated. If there is no evidence of immunity, females who are pregnant should delay immunization until after pregnancy.  Pneumococcal 13-valent conjugate (PCV13) vaccine.** / Consult your health care provider.  Pneumococcal polysaccharide (PPSV23) vaccine.** / 1 to 2 doses if you smoke cigarettes or if you have certain conditions.  Meningococcal vaccine.** / Consult your health care provider.  Hepatitis A vaccine.** / Consult your health care provider.  Hepatitis B vaccine.** / Consult your health care provider.  Haemophilus influenzae type b (Hib) vaccine.** / Consult your health care provider. Ages 65   years and over  Blood pressure check.** / Every 1 to 2 years.  Lipid and cholesterol check.** / Every 5 years beginning at age 22 years.  Lung cancer screening. / Every year if you are aged 73-80 years and have a 30-pack-year history of smoking and currently smoke or have quit within the past 15 years. Yearly screening is stopped once you have quit smoking for at least 15 years or develop a health problem that would prevent you from having lung cancer  treatment.  Clinical breast exam.** / Every year after age 4 years.  BRCA-related cancer risk assessment.** / For women who have family members with a BRCA-related cancer (breast, ovarian, tubal, or peritoneal cancers).  Mammogram.** / Every year beginning at age 40 years and continuing for as long as you are in good health. Consult with your health care provider.  Pap test.** / Every 3 years starting at age 9 years through age 34 or 91 years with 3 consecutive normal Pap tests. Testing can be stopped between 65 and 70 years with 3 consecutive normal Pap tests and no abnormal Pap or HPV tests in the past 10 years.  HPV screening.** / Every 3 years from ages 57 years through ages 64 or 45 years with a history of 3 consecutive normal Pap tests. Testing can be stopped between 65 and 70 years with 3 consecutive normal Pap tests and no abnormal Pap or HPV tests in the past 10 years.  Fecal occult blood test (FOBT) of stool. / Every year beginning at age 15 years and continuing until age 17 years. You may not need to do this test if you get a colonoscopy every 10 years.  Flexible sigmoidoscopy or colonoscopy.** / Every 5 years for a flexible sigmoidoscopy or every 10 years for a colonoscopy beginning at age 86 years and continuing until age 71 years.  Hepatitis C blood test.** / For all people born from [birth year] through 1967 and any individual with known risks for hepatitis C.  Osteoporosis screening.** / A one-time screening for women ages 83 years and over and women at risk for fractures or osteoporosis.  Skin self-exam. / Monthly.  Influenza vaccine. / Every year.  Tetanus, diphtheria, and acellular pertussis (Tdap/Td) vaccine.** / 1 dose of Td every 10 years.  Varicella vaccine.** / Consult your health care provider.  Zoster vaccine.** / 1 dose for adults aged 61 years or older.  Pneumococcal 13-valent conjugate (PCV13) vaccine.** / Consult your health care provider.  Pneumococcal  polysaccharide (PPSV23) vaccine.** / 1 dose for all adults aged 28 years and older.  Meningococcal vaccine.** / Consult your health care provider.  Hepatitis A vaccine.** / Consult your health care provider.  Hepatitis B vaccine.** / Consult your health care provider.  Haemophilus influenzae type b (Hib) vaccine.** / Consult your health care provider. ** Family history and personal history of risk and conditions may change your health care provider's recommendations. Document Released: 11/30/2001 Document Revised: 02/18/2014 Document Reviewed: 03/01/2011 [facility] Patient Information 2015 [place], [place]. This information is not intended to replace advice given to you by your health care provider. Make sure you discuss any questions you have with your health care provider.

## 2015-02-26 NOTE — Progress Notes (Signed)
GYNECOLOGY CLINIC ANNUAL PREVENTATIVE CARE ENCOUNTER NOTE  Subjective:     April Malone is a 24 y.o. [id number] female here for a routine annual gynecologic exam.  Current complaints: wants to discuss contraception.  Currently on Depo Provera, but mother tells her she needs to have a period.  Next injection is due in two weeks. Wants to know if she should change contraception method. No other GYN concerns.    Gynecologic History No LMP recorded. Patient has had an injection. Contraception: Depo-Provera injections Last Pap: over a year ago. Results were: normal  Obstetric History OB History  Gravida Para Term Preterm AB SAB TAB Ectopic Multiple Living  1 1 1       1     # Outcome Date GA Lbr Len/2nd Weight Sex Delivery Anes PTL Lv  1 Term 08/07/07 [id number]  6 lb 12 oz (3.062 kg) F Vag-Spont         Past Medical History  Diagnosis Date  . UJWJXBJY(782.9Headache(784.0)     Past Surgical History  Procedure Laterality Date  . Hernia repair    . Wisdom tooth extraction     No medications.  No Known Allergies  History   Social History  . Marital Status: Single    Spouse Name: N/A  . Number of Children: N/A  . Years of Education: N/A   Occupational History  . Not on file.   Social History Main Topics  . Smoking status: Never Smoker   . Smokeless tobacco: Never Used  . Alcohol Use: No  . Drug Use: No  . Sexual Activity: Yes    Birth Control/ Protection: Injection   Other Topics Concern  . Not on file   Social History Narrative    Family History  Problem Relation Age of Onset  . Other Neg Hx   . Sickle cell trait Sister   . Asthma Brother   . Allergies Brother     The following portions of the patient's history were reviewed and updated as appropriate: allergies, current medications, past family history, past medical history, past social history, past surgical history and problem list.  Review of Systems A comprehensive review of systems was negative.   Objective:    BP 130/57 mmHg  Pulse 87  Temp(Src) 98.8 F (37.1 C) (Oral)  Wt 226 lb 1.6 oz (102.558 kg) CONSTITUTIONAL: Well-developed, well-nourished female in no acute distress.  HENT:  Normocephalic, atraumatic, External right and left ear normal. Oropharynx is clear and moist EYES: Conjunctivae and EOM are normal. Pupils are equal, round, and reactive to light. No scleral icterus.  NECK: Normal range of motion, supple, no masses.  Normal thyroid.  SKIN: Skin is warm and dry. No rash noted. Not diaphoretic. No erythema. No pallor. NEUROLGIC: Alert and oriented to person, place, and time. Normal reflexes, muscle tone coordination. No cranial nerve deficit noted. PSYCHIATRIC: Normal mood and affect. Normal behavior. Normal judgment and thought content. CARDIOVASCULAR: Normal heart rate noted, regular rhythm RESPIRATORY: Clear to auscultation bilaterally. Effort and breath sounds normal, no problems with respiration noted. BREASTS: Symmetric in size. No masses, skin changes, nipple drainage, or lymphadenopathy. ABDOMEN: Soft, normal bowel sounds, no distention noted.  No tenderness, rebound or guarding.  PELVIC: Normal appearing external genitalia; normal appearing vaginal mucosa and cervix.  Normal appearing discharge.  Pap smear obtained.  Normal uterine size, no other palpable masses, no uterine or adnexal tenderness. MUSCULOSKELETAL: Normal range of motion. No tenderness.  No cyanosis, clubbing, or edema.  2+  distal pulses.   Assessment:   Annual gynecologic examination with pap smear Depo Provera contraception counseling   Plan:   Will follow up results of pap smear and manage accordingly. Counseled patient that there was no need for a monthly period as Depo Provera keeps her uterine lining thin. Explained physiology of female reproductive cycle in detail and how Depo Provera affects this.  Patient verbalized understanding, just wants to continue Depo Provera as scheduled. Routine  preventative health maintenance measures emphasized   Jaynie CollinsUGONNA  Numair Masden, MD, FACOG Attending Obstetrician & Gynecologist [facility], [facility]

## 2015-02-28 LAB — CYTOLOGY - PAP

## 2015-03-10 ENCOUNTER — Ambulatory Visit (INDEPENDENT_AMBULATORY_CARE_PROVIDER_SITE_OTHER): Payer: Medicaid Other | Admitting: General Practice

## 2015-03-10 VITALS — BP 126/71 | HR 97 | Temp 98.9°F | Ht 65.0 in | Wt 227.3 lb

## 2015-03-10 DIAGNOSIS — Z3042 Encounter for surveillance of injectable contraceptive: Secondary | ICD-10-CM

## 2015-03-10 MED ORDER — MEDROXYPROGESTERONE ACETATE 104 MG/0.65ML ~~LOC~~ SUSP
104.0000 mg | Freq: Once | SUBCUTANEOUS | Status: AC
  Administered 2015-03-10: 104 mg via SUBCUTANEOUS

## 2015-04-08 ENCOUNTER — Encounter (HOSPITAL_COMMUNITY): Payer: Self-pay | Admitting: Emergency Medicine

## 2015-04-08 ENCOUNTER — Emergency Department (HOSPITAL_COMMUNITY)
Admission: EM | Admit: 2015-04-08 | Discharge: 2015-04-08 | Disposition: A | Discharge status: Home / Self Care | Payer: No Typology Code available for payment source | Attending: Emergency Medicine | Admitting: Emergency Medicine

## 2015-04-08 DIAGNOSIS — Z8679 Personal history of other diseases of the circulatory system: Secondary | ICD-10-CM | POA: Insufficient documentation

## 2015-04-08 DIAGNOSIS — Y999 Unspecified external cause status: Secondary | ICD-10-CM | POA: Diagnosis not present

## 2015-04-08 DIAGNOSIS — S4991XA Unspecified injury of right shoulder and upper arm, initial encounter: Secondary | ICD-10-CM | POA: Diagnosis not present

## 2015-04-08 DIAGNOSIS — T148 Other injury of unspecified body region: Secondary | ICD-10-CM | POA: Diagnosis not present

## 2015-04-08 DIAGNOSIS — Y9389 Activity, other specified: Secondary | ICD-10-CM | POA: Diagnosis not present

## 2015-04-08 DIAGNOSIS — S3992XA Unspecified injury of lower back, initial encounter: Secondary | ICD-10-CM | POA: Insufficient documentation

## 2015-04-08 DIAGNOSIS — T148XXA Other injury of unspecified body region, initial encounter: Secondary | ICD-10-CM

## 2015-04-08 DIAGNOSIS — S199XXA Unspecified injury of neck, initial encounter: Secondary | ICD-10-CM | POA: Diagnosis present

## 2015-04-08 DIAGNOSIS — Y92410 Unspecified street and highway as the place of occurrence of the external cause: Secondary | ICD-10-CM | POA: Insufficient documentation

## 2015-04-08 DIAGNOSIS — S4992XA Unspecified injury of left shoulder and upper arm, initial encounter: Secondary | ICD-10-CM | POA: Insufficient documentation

## 2015-04-08 MED ORDER — IBUPROFEN 800 MG PO TABS: 800.0000 mg | ORAL_TABLET | Freq: Three times a day (TID) | ORAL | Status: DC

## 2015-04-08 MED ORDER — IBUPROFEN 800 MG PO TABS
800.0000 mg | ORAL_TABLET | Freq: Once | ORAL | Status: AC
  Administered 2015-04-08: 800 mg via ORAL
  Filled 2015-04-08: qty 1

## 2015-04-08 MED ORDER — CYCLOBENZAPRINE HCL 10 MG PO TABS: 10.0000 mg | ORAL_TABLET | Freq: Two times a day (BID) | ORAL | Status: DC | PRN

## 2015-04-08 MED ORDER — CYCLOBENZAPRINE HCL 10 MG PO TABS
10.0000 mg | ORAL_TABLET | Freq: Once | ORAL | Status: AC
  Administered 2015-04-08: 10 mg via ORAL
  Filled 2015-04-08: qty 1
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
## 2015-06-02 ENCOUNTER — Ambulatory Visit (INDEPENDENT_AMBULATORY_CARE_PROVIDER_SITE_OTHER): Payer: Medicaid Other

## 2015-06-02 VITALS — BP 123/76 | HR 92 | Wt 231.4 lb

## 2015-06-02 DIAGNOSIS — Z3042 Encounter for surveillance of injectable contraceptive: Secondary | ICD-10-CM

## 2015-06-02 MED ORDER — MEDROXYPROGESTERONE ACETATE 150 MG/ML IM SUSP
150.0000 mg | Freq: Once | INTRAMUSCULAR | Status: AC
  Administered 2015-06-02: 150 mg via INTRAMUSCULAR

## 2015-08-20 ENCOUNTER — Ambulatory Visit: Payer: Medicaid Other

## 2015-08-20 ENCOUNTER — Telehealth: Payer: Self-pay | Admitting: Obstetrics and Gynecology

## 2015-08-20 ENCOUNTER — Telehealth: Payer: Self-pay | Admitting: *Deleted

## 2015-08-20 DIAGNOSIS — Z30011 Encounter for initial prescription of contraceptive pills: Secondary | ICD-10-CM

## 2015-08-20 NOTE — Telephone Encounter (Signed)
April StallsKyndra called back and talked with me re: her desire to switch from depo-provera injections to birth control pills . States at her last visit in May discussed with April Malone about getting married in about a year and discussed it takes a while when you stop shots to get pregnant. Was told to call around time for her shot. Shot was due today.  States has been on pill before, doesn't remember brand and it doesn't matter which one. Explained to her April Malone not here today- will send message to her and ask for birth control pills. Will call her when order completed.  Also instructed patient to use condoms starting now until on second pack of pills. She voices understanding.

## 2015-08-20 NOTE — Telephone Encounter (Signed)
Patient called and stated she called this morning at 7:30am about getting her birth control changed to a pill. The nurse on call informed her the office wasn't open yet, and someone would get back with her about this appointment. She also stated that the nurse said she could probably get her birth control changed to a pill called in by an [name]. I sent her to the nurse line, and told her she could leave this on the voicemail. If she needed to come in, the nurse would let us know to make an appointment. Patient voiced understanding.

## 2015-08-20 NOTE — Telephone Encounter (Signed)
Pt left message stating that she is not able to keep her appt today for depo injection and has notified the front office. She wants to stop receiving the injections and change to birth control pills. Please send Rx [address] [facility] [address] **Per chart review, pt denied problems with Depo injection and had discussion with Dr. Macon LargeAnyanwu about whether she needed to have periods. She was advised that she did not need to have periods and full explanation given. I called pt and left message requesting her to call back with her questions or problems with current birth control.

## 2015-08-21 MED ORDER — NORGESTIMATE-ETH ESTRADIOL 0.25-35 MG-MCG PO TABS: 1.0000 | ORAL_TABLET | Freq: Every day | ORAL | Status: DC

## 2015-08-21 NOTE — Telephone Encounter (Signed)
Called patient to inform her that Dr. Macon LargeAnyanwu prescribed birth control pills. No answer. Sent patient mychart message to inform her.

## 2015-08-21 NOTE — Telephone Encounter (Signed)
Sprintec prescribed, patient can switch to this.

## 2015-08-25 NOTE — Telephone Encounter (Signed)
April Malone and she states she had gotten the message, has already picked up and started taking the sprintec. States no problems.

## 2016-02-06 ENCOUNTER — Ambulatory Visit: Payer: Medicaid Other | Admitting: Family Medicine

## 2016-02-06 NOTE — Progress Notes (Signed)
Pt not seen- rescheduled.  

## 2016-06-06 ENCOUNTER — Inpatient Hospital Stay (HOSPITAL_COMMUNITY)
Admission: AD | Admit: 2016-06-06 | Discharge: 2016-06-06 | Disposition: A | Discharge status: Home / Self Care | Payer: Medicaid Other | Source: Ambulatory Visit | Attending: Obstetrics and Gynecology | Admitting: Obstetrics and Gynecology

## 2016-06-06 ENCOUNTER — Encounter (HOSPITAL_COMMUNITY): Payer: Self-pay

## 2016-06-06 DIAGNOSIS — R1011 Right upper quadrant pain: Secondary | ICD-10-CM | POA: Insufficient documentation

## 2016-06-06 DIAGNOSIS — K219 Gastro-esophageal reflux disease without esophagitis: Secondary | ICD-10-CM | POA: Insufficient documentation

## 2016-06-06 LAB — URINALYSIS, ROUTINE W REFLEX MICROSCOPIC
Bilirubin Urine: NEGATIVE
GLUCOSE, UA: NEGATIVE mg/dL
Hgb urine dipstick: NEGATIVE
Ketones, ur: NEGATIVE mg/dL
Leukocytes, UA: NEGATIVE
Nitrite: NEGATIVE
PH: 6 (ref 5.0–8.0)
Protein, ur: NEGATIVE mg/dL
Specific Gravity, Urine: >1.03 — ABNORMAL HIGH (ref 1.005–1.030)

## 2016-06-06 LAB — WET PREP, GENITAL
CLUE CELLS WET PREP: NONE SEEN
SPERM: NONE SEEN
Trich, Wet Prep: NONE SEEN
Yeast Wet Prep HPF POC: NONE SEEN

## 2016-06-06 LAB — CBC
HEMATOCRIT: 34.9 % — AB (ref 36.0–46.0)
Hemoglobin: 11.8 g/dL — ABNORMAL LOW (ref 12.0–15.0)
MCH: 28 pg (ref 26.0–34.0)
MCHC: 33.8 g/dL (ref 30.0–36.0)
MCV: 82.7 fL (ref 78.0–100.0)
Platelets: 336 10*3/uL (ref 150–400)
RBC: 4.22 MIL/uL (ref 3.87–5.11)
RDW: 12.9 % (ref 11.5–15.5)
WBC: 11.5 10*3/uL — ABNORMAL HIGH (ref 4.0–10.5)

## 2016-06-06 LAB — POCT PREGNANCY, URINE: Preg Test, Ur: NEGATIVE

## 2016-06-06 LAB — HCG, QUANTITATIVE, PREGNANCY: hCG, Beta Chain, Quant, S: 1 m[IU]/mL (ref <5)

## 2016-06-06 MED ORDER — PANTOPRAZOLE SODIUM 40 MG PO TBEC: 40.0000 mg | DELAYED_RELEASE_TABLET | Freq: Every day | ORAL | 3 refills | Status: DC

## 2016-06-06 MED ORDER — GI COCKTAIL ~~LOC~~
30.0000 mL | Freq: Once | ORAL | Status: AC
  Administered 2016-06-06: 30 mL via ORAL
  Filled 2016-06-06: qty 30

## 2016-06-06 NOTE — MAU Note (Signed)
Pt presents complaining of left lower abominal pain that started Friday and has gotten worse. Took pregnancy tests at home and some were negative and one was positive. Stopped depo in August 2016. LMP 6/24. Tried ibuprofen for pain but it doesn't help.

## 2016-06-06 NOTE — Discharge Instructions (Signed)
Food Choices for Gastroesophageal Reflux Disease, Adult When you have gastroesophageal reflux disease (GERD), the foods you eat and your eating habits are very important. Choosing the right foods can help ease the discomfort of GERD. WHAT GENERAL GUIDELINES DO I NEED TO FOLLOW?  Choose fruits, vegetables, whole grains, low-fat dairy products, and low-fat meat, fish, and poultry.  Limit fats such as oils, salad dressings, butter, nuts, and avocado.  Keep a food diary to identify foods that cause symptoms.  Avoid foods that cause reflux. These may be different for different people.  Eat frequent small meals instead of three large meals each day.  Eat your meals slowly, in a relaxed setting.  Limit fried foods.  Cook foods using methods other than frying.  Avoid drinking alcohol.  Avoid drinking large amounts of liquids with your meals.  Avoid bending over or lying down until 2-3 hours after eating. WHAT FOODS ARE NOT RECOMMENDED? The following are some foods and drinks that may worsen your symptoms: Vegetables Tomatoes. Tomato juice. Tomato and spaghetti sauce. Chili peppers. Onion and garlic. Horseradish. Fruits Oranges, grapefruit, and lemon (fruit and juice). Meats High-fat meats, fish, and poultry. This includes hot dogs, ribs, ham, sausage, salami, and bacon. Dairy Whole milk and chocolate milk. Sour cream. Cream. Butter. Ice cream. Cream cheese.  Beverages Coffee and tea, with or without caffeine. Carbonated beverages or energy drinks. Condiments Hot sauce. Barbecue sauce.  Sweets/Desserts Chocolate and cocoa. Donuts. Peppermint and spearmint. Fats and Oils High-fat foods, including French fries and potato chips. Other Vinegar. Strong spices, such as black pepper, white pepper, red pepper, cayenne, curry powder, cloves, ginger, and chili powder. The items listed above may not be a complete list of foods and beverages to avoid. Contact your dietitian for more  information.   This information is not intended to replace advice given to you by your health care provider. Make sure you discuss any questions you have with your health care provider.   Document Released: 10/04/2005 Document Revised: 10/25/2014 Document Reviewed: 08/08/2013 Elsevier Interactive Patient Education 2016 Elsevier Inc.  

## 2016-06-06 NOTE — MAU Provider Note (Signed)
History     CSN: [id number]  Arrival date and time: 06/06/16 2018   First Provider Initiated Contact with Patient 06/06/16 2106      Chief Complaint  Patient presents with  . Abdominal Pain   April Malone is a 25 y.o. G1P1001 who presents today with abdominal pain. She states that her LMP was 04/10/16. She was on depo until 05/2015, and had normal period since stopping. She reports that she started having RUQ pain on Friday. She reports that she had one positive pregnancy test at home and one negative test.    Abdominal Pain  This is a new problem. The current episode started in the past 7 days. The onset quality is gradual. The problem occurs constantly. The problem has been unchanged. The pain is located in the RUQ. The pain is at a severity of 8/10. The quality of the pain is sharp. The abdominal pain radiates to the back. Associated symptoms include nausea and vomiting. Pertinent negatives include no constipation, diarrhea, dysuria, fever or frequency. Nothing (patient last ate spaghetti with meat sauce prior to the pain starting. ) aggravates the pain. The pain is relieved by nothing. Treatments tried: NSAIDS. The treatment provided no relief.    Past Medical History:  Diagnosis Date  . WUJWJXBJ(478.2Headache(784.0)     Past Surgical History:  Procedure Laterality Date  . HERNIA REPAIR    . WISDOM TOOTH EXTRACTION      Family History  Problem Relation Age of Onset  . Sickle cell trait Sister   . Asthma Brother   . Allergies Brother   . Other Neg Hx     Social History  Substance Use Topics  . Smoking status: Never Smoker  . Smokeless tobacco: Never Used  . Alcohol use No    Allergies: No Known Allergies  Prescriptions Prior to Admission  Medication Sig Dispense Refill Last Dose  . acetaminophen (TYLENOL) 500 MG tablet Take 1,000 mg by mouth every 6 (six) hours as needed (For pain.).   11/19/2014 at 10pm  . cyclobenzaprine (FLEXERIL) 10 MG tablet Take 1 tablet (10 mg total) by  mouth 2 (two) times daily as needed for muscle spasms. 20 tablet 0   . ibuprofen (ADVIL,MOTRIN) 800 MG tablet Take 1 tablet (800 mg total) by mouth 3 (three) times daily. 21 tablet 0   . norgestimate-ethinyl estradiol (ORTHO-CYCLEN,SPRINTEC,PREVIFEM) 0.25-35 MG-MCG tablet Take 1 tablet by mouth daily. 1 Package 11     Review of Systems  Constitutional: Negative for chills and fever.  Gastrointestinal: Positive for abdominal pain, nausea and vomiting. Negative for constipation and diarrhea.  Genitourinary: Negative for dysuria, frequency and urgency.   Physical Exam   Blood pressure 145/70, pulse 92, temperature 98.3 F (36.8 C), resp. rate 18, last menstrual period 04/10/2016.  Physical Exam  Nursing note and vitals reviewed. Constitutional: She is oriented to person, place, and time. She appears well-developed and well-nourished. No distress.  HENT:  Head: Normocephalic.  Cardiovascular: Normal rate.   Respiratory: Effort normal.  GI: Soft. There is no tenderness. There is no rebound.  Genitourinary:  Genitourinary Comments:  External: no lesion Vagina: small amount of white discharge Cervix: pink, smooth, no CMT Uterus: NSSC Adnexa: NT   Neurological: She is alert and oriented to person, place, and time.  Skin: Skin is warm and dry.  Psychiatric: She has a normal mood and affect.    Results for orders placed or performed during the hospital encounter of 06/06/16 (from the past 24 hour(s))  Urinalysis, Routine w reflex microscopic (not [facility])     Status: Abnormal   Collection Time: 06/06/16  8:25 PM  Result Value Ref Range   Color, Urine YELLOW YELLOW   APPearance CLEAR CLEAR   Specific Gravity, Urine >1.030 (H) 1.005 - 1.030   pH 6.0 5.0 - 8.0   Glucose, UA NEGATIVE NEGATIVE mg/dL   Hgb urine dipstick NEGATIVE NEGATIVE   Bilirubin Urine NEGATIVE NEGATIVE   Ketones, ur NEGATIVE NEGATIVE mg/dL   Protein, ur NEGATIVE NEGATIVE mg/dL   Nitrite NEGATIVE NEGATIVE    Leukocytes, UA NEGATIVE NEGATIVE  Pregnancy, urine POC     Status: None   Collection Time: 06/06/16  8:30 PM  Result Value Ref Range   Preg Test, Ur NEGATIVE NEGATIVE  CBC     Status: Abnormal   Collection Time: 06/06/16  8:55 PM  Result Value Ref Range   WBC 11.5 (H) 4.0 - 10.5 K/uL   RBC 4.22 3.87 - 5.11 MIL/uL   Hemoglobin 11.8 (L) 12.0 - 15.0 g/dL   HCT 40.934.9 (L) 81.136.0 - 91.446.0 %   MCV 82.7 78.0 - 100.0 fL   MCH 28.0 26.0 - 34.0 pg   MCHC 33.8 30.0 - 36.0 g/dL   RDW 78.212.9 95.611.5 - 21.315.5 %   Platelets 336 150 - 400 K/uL  hCG, quantitative, pregnancy     Status: None   Collection Time: 06/06/16  8:55 PM  Result Value Ref Range   hCG, Beta Chain, Quant, S 1 <5 mIU/mL  Wet prep, genital     Status: Abnormal   Collection Time: 06/06/16  9:25 PM  Result Value Ref Range   Yeast Wet Prep HPF POC NONE SEEN NONE SEEN   Trich, Wet Prep NONE SEEN NONE SEEN   Clue Cells Wet Prep HPF POC NONE SEEN NONE SEEN   WBC, Wet Prep HPF POC FEW (A) NONE SEEN   Sperm NONE SEEN     MAU Course  Procedures  MDM 2245: Patient has had GI cocktail and reports that her pain is now 0/10   Assessment and Plan   1. Gastroesophageal reflux disease, esophagitis presence not specified    DC home Comfort measures reviewed  RX: protonix QD #30  Return to MAU as needed  Follow-up Information    [facility]   Specialty:  Obstetrics and Gynecology Contact information: [address] [place] [id number] [phone]           Tawnya CrookHogan, Heather Donovan 06/06/2016, 9:19 PM

## 2016-06-07 LAB — RPR: RPR Ser Ql: NONREACTIVE

## 2016-06-07 LAB — GC/CHLAMYDIA PROBE AMP (~~LOC~~) NOT AT ARMC
Chlamydia: NEGATIVE
Neisseria Gonorrhea: NEGATIVE

## 2016-06-07 LAB — HIV ANTIBODY (ROUTINE TESTING W REFLEX): HIV Screen 4th Generation wRfx: NONREACTIVE

## 2016-06-24 ENCOUNTER — Encounter: Payer: Self-pay | Admitting: Obstetrics and Gynecology

## 2016-06-25 ENCOUNTER — Encounter: Payer: Self-pay | Admitting: Obstetrics and Gynecology

## 2016-06-25 NOTE — Progress Notes (Signed)
Patient did not keep 06/24/2016 GYN appointment  Arlind Klingerman, Jr MD Attending [facility])  

## 2016-07-08 ENCOUNTER — Telehealth: Payer: Self-pay | Admitting: *Deleted

## 2016-07-08 NOTE — Telephone Encounter (Signed)
Pt left message today @ 0801 stating that she thinks she has a yeast infection and requests Rx.

## 2016-07-13 NOTE — Telephone Encounter (Signed)
Patient left message stating she was having "pregnancy symptoms" and has recently go to the ER with same. Please return her call. She has an MAU visit on 8/22 and no showed for a gyn appt on 9/7.

## 2016-07-16 NOTE — Telephone Encounter (Signed)
Patient has upcoming appt on 07/30/2016.

## 2016-07-30 ENCOUNTER — Encounter: Payer: Self-pay | Admitting: Obstetrics & Gynecology

## 2016-08-20 ENCOUNTER — Encounter: Payer: Self-pay | Admitting: Obstetrics and Gynecology

## 2016-08-25 ENCOUNTER — Other Ambulatory Visit: Payer: Self-pay | Admitting: Obstetrics & Gynecology

## 2016-08-25 DIAGNOSIS — Z30011 Encounter for initial prescription of contraceptive pills: Secondary | ICD-10-CM

## 2016-08-26 ENCOUNTER — Encounter: Payer: Self-pay | Admitting: Family Medicine

## 2016-08-26 ENCOUNTER — Ambulatory Visit (INDEPENDENT_AMBULATORY_CARE_PROVIDER_SITE_OTHER): Payer: Medicaid Other | Admitting: Obstetrics & Gynecology

## 2016-08-26 ENCOUNTER — Encounter: Payer: Self-pay | Admitting: Obstetrics & Gynecology

## 2016-08-26 VITALS — BP 135/77 | HR 89 | Wt 235.4 lb

## 2016-08-26 DIAGNOSIS — Z30017 Encounter for initial prescription of implantable subdermal contraceptive: Secondary | ICD-10-CM

## 2016-08-26 MED ORDER — ETONOGESTREL 68 MG ~~LOC~~ IMPL
68.0000 mg | DRUG_IMPLANT | Freq: Once | SUBCUTANEOUS | Status: AC
Start: 2016-08-26 — End: 2016-08-26
  Administered 2016-08-26: 68 mg via SUBCUTANEOUS

## 2016-08-26 NOTE — Addendum Note (Signed)
Addended by: Faythe CasaBELLAMY, Felica Chargois M on: 08/26/2016 03:05 PM   Modules accepted: Orders

## 2016-08-26 NOTE — Progress Notes (Signed)
   Subjective:    Patient ID: April Malone, female    DOB: 10-Jul-1991, 25 y.o.   MRN: [id number]  HPI  She is here today for Nexplanon insertion. She has used 2 others in the past and wants to restart this method as she forgets her OCPs at times.  Review of Systems     Objective:   Physical Exam  Obese pleasant BFNAD Breathing, conversing, and ambulating normally UPT was negative. Consent was signed. Time out procedure was done. Her left arm was prepped with betadine and infiltrated with 3 cc of 1% lidocaine. After adequate anesthesia was assured, the Nexplanon device was placed according to standard of care. Her arm was hemostatic and was bandaged. She tolerated the procedure well.       Assessment & Plan:  Contraception- Nexplanon- rec back up for 2 weeks

## 2016-08-27 LAB — POCT PREGNANCY, URINE: PREG TEST UR: NEGATIVE

## 2016-09-22 ENCOUNTER — Encounter: Payer: Self-pay | Admitting: Obstetrics and Gynecology

## 2017-01-13 ENCOUNTER — Encounter (HOSPITAL_COMMUNITY): Payer: Self-pay | Admitting: Emergency Medicine

## 2017-01-13 ENCOUNTER — Emergency Department (HOSPITAL_COMMUNITY)
Admission: EM | Admit: 2017-01-13 | Discharge: 2017-01-13 | Disposition: A | Discharge status: Home / Self Care | Payer: BLUE CROSS/BLUE SHIELD | Attending: Emergency Medicine | Admitting: Emergency Medicine

## 2017-01-13 DIAGNOSIS — K0889 Other specified disorders of teeth and supporting structures: Secondary | ICD-10-CM

## 2017-01-13 MED ORDER — HYDROCODONE-ACETAMINOPHEN 5-325 MG PO TABS: 1.0000 | ORAL_TABLET | ORAL | 0 refills | Status: DC | PRN

## 2017-01-13 MED ORDER — PENICILLIN V POTASSIUM 500 MG PO TABS: 500.0000 mg | ORAL_TABLET | Freq: Three times a day (TID) | ORAL | 0 refills | Status: DC

## 2017-01-13 NOTE — ED Provider Notes (Signed)
[facility]-EMERGENCY DEPT Provider Note   CSN: [id number] Arrival date & time: 01/13/17  4098     History   Chief Complaint No chief complaint on file.   HPI April Malone is a 26 y.o. female.  HPI Pt presents to the ER with increasing right upper dental pain over the past 4-5 days. Reports bleeding when she brushes and feels like she has a "pus bump" on her gums. Called her dentist, appt scheduled for April 16th. Presents today with increasing pain. No bleeding now. No difficulty breathing or swallowing. No facial swelling. No other complaints   Past Medical History:  Diagnosis Date  . Headache(784.0)     There are no active problems to display for this patient.   Past Surgical History:  Procedure Laterality Date  . HERNIA REPAIR    . WISDOM TOOTH EXTRACTION      OB History    Gravida Para Term Preterm AB Living   1 1 1     1    SAB TAB Ectopic Multiple Live Births                   Home Medications    Prior to Admission medications   Medication Sig Start Date End Date Taking? Authorizing Provider  acetaminophen (TYLENOL) 500 MG tablet Take 1,000 mg by mouth every 6 (six) hours as needed (For pain.).    Historical Provider, MD  HYDROcodone-acetaminophen (NORCO/VICODIN) 5-325 MG tablet Take 1 tablet by mouth every 4 (four) hours as needed for moderate pain. 01/13/17   Azalia Bilis, MD  [facility] 0.25-35 MG-MCG tablet take 1 tablet by mouth once daily 08/25/16   Tereso Newcomer, MD  pantoprazole (PROTONIX) 40 MG tablet Take 1 tablet (40 mg total) by mouth daily. Patient not taking: Reported on 08/26/2016 06/06/16   Armando Reichert, CNM  penicillin v potassium (VEETID) 500 MG tablet Take 1 tablet (500 mg total) by mouth 3 (three) times daily. 01/13/17   Azalia Bilis, MD    Family History Family History  Problem Relation Age of Onset  . Sickle cell trait Sister   . Asthma Brother   . Allergies Brother   . Other Neg Hx     Social History Social History  Substance  Use Topics  . Smoking status: Never Smoker  . Smokeless tobacco: Never Used  . Alcohol use No     Allergies   Patient has no known allergies.   Review of Systems Review of Systems  All other systems reviewed and are negative.    Physical Exam Updated Vital Signs BP (!) 133/91 (BP Location: Left Arm)   Pulse (!) 104   Temp 98.8 F (37.1 C) (Oral)   Resp 20   Ht 5\' 5"  (1.651 m)   Wt 235 lb (106.6 kg)   SpO2 96%   BMI 39.11 kg/m   Physical Exam  Constitutional: She is oriented to person, place, and time. She appears well-developed and well-nourished.  HENT:  Head: Normocephalic.  Mild tenderness of right upper first molar. No gingival swelling or fluctuance. No bleeding. Tolerating secretions  Eyes: EOM are normal.  Neck: Normal range of motion.  Anterior neck normal  Pulmonary/Chest: Effort normal.  Abdominal: She exhibits no distension.  Musculoskeletal: Normal range of motion.  Neurological: She is alert and oriented to person, place, and time.  Psychiatric: She has a normal mood and affect.  Nursing note and vitals reviewed.    ED Treatments / Results  Labs (all labs  ordered are listed, but only abnormal results are displayed) Labs Reviewed - No data to display  EKG  EKG Interpretation None       Radiology No results found.  Procedures Procedures (including critical care time)  Medications Ordered in ED Medications - No data to display   Initial Impression / Assessment and Plan / ED Course  I have reviewed the triage vital signs and the nursing notes.  Pertinent labs & imaging results that were available during my care of the patient were reviewed by me and considered in my medical decision making (see chart for details).     Dental Pain. Home with antibiotics and pain medicine. Recommend dental follow up. No signs of gingival abscess. Tolerating secretions. Airway patent. No sub lingular swelling   Final Clinical Impressions(s) / ED  Diagnoses   Final diagnoses:  Pain, dental    New Prescriptions New Prescriptions   HYDROCODONE-ACETAMINOPHEN (NORCO/VICODIN) 5-325 MG TABLET    Take 1 tablet by mouth every 4 (four) hours as needed for moderate pain.   PENICILLIN V POTASSIUM (VEETID) 500 MG TABLET    Take 1 tablet (500 mg total) by mouth 3 (three) times daily.     Azalia BilisKevin Andreka Stucki, MD 01/13/17 [id number]

## 2017-01-13 NOTE — ED Triage Notes (Signed)
Pt  c/o rt dental pain x 1 wk.

## 2017-01-13 NOTE — Discharge Instructions (Signed)
Please follow up with your dentist  Return to the emergency department for any new or worsening symptoms

## 2017-01-18 ENCOUNTER — Telehealth: Payer: Self-pay | Admitting: *Deleted

## 2017-01-18 NOTE — Telephone Encounter (Signed)
Patient left message was in ER last Thursday, started on PCN. Feeds like she may have a yeast infection now. Please return her call.

## 2017-01-18 NOTE — Telephone Encounter (Addendum)
Called pt and left message stating that I am returning her call from yesterday. We can send the requested prescription however we do not have a pharmacy listed for her. Please call back and leave a message stating that name and location of her pharmacy. Per standing order, pt can have Diflucan 150 mg, 1 tablet po.   4/4  0845  Spoke w/pt and advised that I can send Rx. She can also use hydrocortisone cream externally for itching and irritation. Pt voiced understanding, gave name of pharmacy and had no questions.

## 2017-01-19 MED ORDER — FLUCONAZOLE 150 MG PO TABS: 150.0000 mg | ORAL_TABLET | Freq: Once | ORAL | 0 refills | Status: AC

## 2017-02-21 ENCOUNTER — Telehealth: Payer: Self-pay | Admitting: *Deleted

## 2017-02-21 NOTE — Telephone Encounter (Signed)
Pt called, states problems with cycle. Please return call.

## 2017-02-22 ENCOUNTER — Telehealth: Payer: Self-pay | Admitting: *Deleted

## 2017-02-22 NOTE — Telephone Encounter (Signed)
mychart message to patient to see what we can help her with regarding her cycle.

## 2017-02-22 NOTE — Telephone Encounter (Signed)
Pt called with some concerns about daily spotting on nexplanon. Advised patient that this is a common side effect. If she develops persistent bleeding or it becomes heavy I recommended that she call us back or make an appointment. Pt is agreeable to this.

## 2017-07-18 ENCOUNTER — Encounter: Payer: Self-pay | Admitting: Obstetrics & Gynecology

## 2017-07-18 ENCOUNTER — Other Ambulatory Visit (HOSPITAL_COMMUNITY)
Admission: RE | Admit: 2017-07-18 | Discharge: 2017-07-18 | Disposition: A | Discharge status: Home / Self Care | Payer: BLUE CROSS/BLUE SHIELD | Source: Ambulatory Visit | Attending: Obstetrics & Gynecology | Admitting: Obstetrics & Gynecology

## 2017-07-18 ENCOUNTER — Ambulatory Visit (INDEPENDENT_AMBULATORY_CARE_PROVIDER_SITE_OTHER): Payer: BLUE CROSS/BLUE SHIELD | Admitting: Obstetrics & Gynecology

## 2017-07-18 VITALS — BP 127/75 | HR 87 | Ht 64.0 in | Wt 230.0 lb

## 2017-07-18 DIAGNOSIS — Z113 Encounter for screening for infections with a predominantly sexual mode of transmission: Secondary | ICD-10-CM | POA: Diagnosis present

## 2017-07-18 DIAGNOSIS — E669 Obesity, unspecified: Secondary | ICD-10-CM | POA: Insufficient documentation

## 2017-07-18 DIAGNOSIS — Z3046 Encounter for surveillance of implantable subdermal contraceptive: Secondary | ICD-10-CM | POA: Insufficient documentation

## 2017-07-18 MED ORDER — NORGESTREL-ETHINYL ESTRADIOL 0.3-30 MG-MCG PO TABS: 1.0000 | ORAL_TABLET | Freq: Every day | ORAL | 11 refills | Status: DC

## 2017-07-18 NOTE — Progress Notes (Signed)
   Subjective:    Patient ID: April Malone, female    DOB: [birth year], 26 y.o.   MRN: [id number]  HPI 26 yo S AA P1 (39 yo daughter) here to have her Nexplanon removed. She finds it painful. This is her second Nexplanon. She has been on OCPs in the past and would like to restart them.    Review of Systems     Objective:   Physical Exam Well nourished, moderately obese pleasant, well hydrated black female, no apparent distress Breathing, conversing, and ambulating normally Consent was signed and time out was done. Her left arm was prepped with betadine after establishing the position of the Nexplanon. The area was infiltrated with 2 cc of 1% lidocaine. A small incision was made and the intact rod was removed and noted to be intact. She had a moderate amount of scar tissue at the insertion site. I rec'd to her that she use the other arm if she gets another Nexplanon.  A steristrip was placed and her arm was noted to be hemostatic. It was bandaged.  She tolerated the procedure well.     Assessment & Plan:  Contraception- Start Lo ovral today. Rec a back up method for 2 weeks STI testing per patient request RTC 1 month for BP check

## 2017-07-19 LAB — GC/CHLAMYDIA PROBE AMP (~~LOC~~) NOT AT ARMC
Chlamydia: NEGATIVE
Neisseria Gonorrhea: NEGATIVE

## 2017-07-19 LAB — HIV ANTIBODY (ROUTINE TESTING W REFLEX): HIV SCREEN 4TH GENERATION: NONREACTIVE

## 2017-07-19 LAB — HEPATITIS B SURFACE ANTIGEN: HEP B S AG: NEGATIVE

## 2017-07-19 LAB — HEPATITIS C ANTIBODY: Hep C Virus Ab: <0.1 s/co ratio (ref 0.0–0.9)

## 2017-07-19 LAB — RPR: RPR Ser Ql: NONREACTIVE

## 2017-07-25 ENCOUNTER — Telehealth: Payer: Self-pay | Admitting: *Deleted

## 2017-07-25 ENCOUNTER — Encounter: Payer: Self-pay | Admitting: Obstetrics & Gynecology

## 2017-07-25 NOTE — Telephone Encounter (Signed)
Rhyan called this am and left a voicemail asking for lab results. I called Ilisha and reviewed results which were negative ( GC, RPR, Hep B, Hep C, and HIV. She voices understanding.

## 2017-12-13 ENCOUNTER — Telehealth: Payer: Self-pay | Admitting: General Practice

## 2017-12-13 NOTE — Telephone Encounter (Signed)
Patient called and left message requesting a call back. Called patient, no answer- left message stating I am trying to reach you to return your phone call, please call us back if you still need assistance. You may also send us a mychart message with your question

## 2017-12-26 ENCOUNTER — Inpatient Hospital Stay (HOSPITAL_COMMUNITY)
Admission: AD | Admit: 2017-12-26 | Discharge: 2017-12-26 | Disposition: A | Discharge status: Home / Self Care | Payer: BLUE CROSS/BLUE SHIELD | Source: Ambulatory Visit | Attending: Obstetrics & Gynecology | Admitting: Obstetrics & Gynecology

## 2017-12-26 ENCOUNTER — Encounter (HOSPITAL_COMMUNITY): Payer: Self-pay | Admitting: *Deleted

## 2017-12-26 DIAGNOSIS — M549 Dorsalgia, unspecified: Secondary | ICD-10-CM | POA: Insufficient documentation

## 2017-12-26 DIAGNOSIS — Z3202 Encounter for pregnancy test, result negative: Secondary | ICD-10-CM | POA: Insufficient documentation

## 2017-12-26 DIAGNOSIS — M545 Low back pain, unspecified: Secondary | ICD-10-CM

## 2017-12-26 DIAGNOSIS — R109 Unspecified abdominal pain: Secondary | ICD-10-CM | POA: Insufficient documentation

## 2017-12-26 LAB — URINALYSIS, ROUTINE W REFLEX MICROSCOPIC
Bilirubin Urine: NEGATIVE
Glucose, UA: NEGATIVE mg/dL
Hgb urine dipstick: NEGATIVE
Ketones, ur: NEGATIVE mg/dL
LEUKOCYTES UA: NEGATIVE
NITRITE: NEGATIVE
PH: 5 (ref 5.0–8.0)
Protein, ur: NEGATIVE mg/dL
SPECIFIC GRAVITY, URINE: 1.023 (ref 1.005–1.030)

## 2017-12-26 LAB — WET PREP, GENITAL
CLUE CELLS WET PREP: NONE SEEN
SPERM: NONE SEEN
TRICH WET PREP: NONE SEEN
YEAST WET PREP: NONE SEEN

## 2017-12-26 LAB — POCT PREGNANCY, URINE: Preg Test, Ur: NEGATIVE

## 2017-12-26 NOTE — MAU Provider Note (Signed)
History     CSN: [id number]  Arrival date and time: 12/26/17 1325   First Provider Initiated Contact with Patient 12/26/17 1422      Chief Complaint  Patient presents with  . Abdominal Pain  . Back Pain  . Possible Pregnancy   HPI LUIS SAMI 27 y.o. Comes to MAU today as she has missed her period for 2 months and has pain in her left back and side.  Her pregnancy test is negative today and she does not have any symptoms of pregnancy.   OB History    Gravida Para Term Preterm AB Living   1 1 1     1    SAB TAB Ectopic Multiple Live Births                  Past Medical History:  Diagnosis Date  . NWGNFAOZ(308.6)     Past Surgical History:  Procedure Laterality Date  . HERNIA REPAIR    . WISDOM TOOTH EXTRACTION      Family History  Problem Relation Age of Onset  . Sickle cell trait Sister   . Asthma Brother   . Allergies Brother   . Other Neg Hx     Social History   Tobacco Use  . Smoking status: Never Smoker  . Smokeless tobacco: Never Used  Substance Use Topics  . Alcohol use: No  . Drug use: No    Allergies: No Known Allergies  Medications Prior to Admission  Medication Sig Dispense Refill Last Dose  . acetaminophen (TYLENOL) 500 MG tablet Take 500 mg by mouth every 6 (six) hours as needed for moderate pain.   12/26/2017 at Unknown time  . norgestrel-ethinyl estradiol (LO/OVRAL,CRYSELLE) 0.3-30 MG-MCG tablet Take 1 tablet by mouth daily. (Patient not taking: Reported on 12/26/2017) 1 Package 11 Not Taking at Unknown time    Review of Systems  Constitutional: Negative for fever.  Gastrointestinal: Negative for abdominal pain, diarrhea, nausea and vomiting.  Genitourinary: Negative for vaginal bleeding and vaginal discharge.   Physical Exam   Blood pressure (!) 145/80, pulse 88, temperature 99.3 F (37.4 C), resp. rate 18, height 5\' 5"  (1.651 m), weight 236 lb (107 kg), last menstrual period 11/13/2017, SpO2 100 %.  Repeat BP is  131/78  Physical Exam  Nursing note and vitals reviewed. Constitutional: She is oriented to person, place, and time. She appears well-developed and well-nourished.  HENT:  Head: Normocephalic.  Eyes: EOM are normal.  Neck: Neck supple.  GI: Soft. There is no tenderness. There is no rebound and no guarding.  Musculoskeletal: Normal range of motion.  Has pain with palpation in left side at iliac crest.  Neurological: She is alert and oriented to person, place, and time.  Skin: Skin is warm and dry.  Psychiatric: She has a normal mood and affect.    MAU Course  Procedures Results for orders placed or performed during the hospital encounter of 12/26/17 (from the past 24 hour(s))  Urinalysis, Routine w reflex microscopic     Status: Abnormal   Collection Time: 12/26/17  1:25 PM  Result Value Ref Range   Color, Urine YELLOW YELLOW   APPearance HAZY (A) CLEAR   Specific Gravity, Urine 1.023 1.005 - 1.030   pH 5.0 5.0 - 8.0   Glucose, UA NEGATIVE NEGATIVE mg/dL   Hgb urine dipstick NEGATIVE NEGATIVE   Bilirubin Urine NEGATIVE NEGATIVE   Ketones, ur NEGATIVE NEGATIVE mg/dL   Protein, ur NEGATIVE NEGATIVE mg/dL   Nitrite  NEGATIVE NEGATIVE   Leukocytes, UA NEGATIVE NEGATIVE  Pregnancy, urine POC     Status: None   Collection Time: 12/26/17  2:24 PM  Result Value Ref Range   Preg Test, Ur NEGATIVE NEGATIVE  Wet prep, genital     Status: Abnormal   Collection Time: 12/26/17  2:40 PM  Result Value Ref Range   Yeast Wet Prep HPF POC NONE SEEN NONE SEEN   Trich, Wet Prep NONE SEEN NONE SEEN   Clue Cells Wet Prep HPF POC NONE SEEN NONE SEEN   WBC, Wet Prep HPF POC MANY (A) NONE SEEN   Sperm NONE SEEN     MDM Does not seem to be a kidney stone, does not seem to be a vaginal infection (labs pending).  May be a musculoskeletal problem and may get better with ibuprofen and time.  Did vaginal swabs to rule out any vaginal infection - client has no symptoms and did not want pelvic  exam.  Assessment and Plan  Back and side pain - left side  Plan Take the ibuprofen you have at home by the directions for the pain you are having. You can use and ice pack to the area as well. Your pregnancy test is negative.  If your period does not start in 2 weeks, repeat your pregnancy test. Establish with a primary care MD or seek further care for persistent back pain at an Urgent Care. You have lab tests that are pending and we will notify you if your results indicate you need additional treatment.  Terri L Burleson 12/26/2017, 3:52 PM

## 2017-12-26 NOTE — MAU Note (Signed)
Pt reports pain in her left lower back for 2 weeks, now pain is radiating around to her right lower abd. Also reports pressure with urination. ? preg

## 2017-12-26 NOTE — Discharge Instructions (Signed)
Take the ibuprofen you have at home by the directions for the pain you are having. You can use and ice pack to the area as well. Your pregnancy test is negative.  If your period does not start in 2 weeks, repeat your pregnancy test. Establish with a primary care MD or seek further care for persistent back pain at an Urgent Care. You have lab tests that are pending and we will notify you if your results indicate you need additional treatment.

## 2017-12-27 LAB — GC/CHLAMYDIA PROBE AMP (~~LOC~~) NOT AT ARMC
Chlamydia: NEGATIVE
Neisseria Gonorrhea: NEGATIVE

## 2017-12-29 ENCOUNTER — Ambulatory Visit: Payer: BLUE CROSS/BLUE SHIELD

## 2018-04-27 ENCOUNTER — Telehealth: Payer: Self-pay | Admitting: *Deleted

## 2018-04-27 NOTE — Telephone Encounter (Signed)
April Malone left a voicemail messge this am stating her cycle is a week and a half late and she is having horrible cramps in her stomach and would like a call back.

## 2018-04-28 ENCOUNTER — Inpatient Hospital Stay (HOSPITAL_COMMUNITY): Payer: BLUE CROSS/BLUE SHIELD

## 2018-04-28 ENCOUNTER — Encounter (HOSPITAL_COMMUNITY): Payer: Self-pay | Admitting: *Deleted

## 2018-04-28 ENCOUNTER — Other Ambulatory Visit: Payer: Self-pay

## 2018-04-28 ENCOUNTER — Inpatient Hospital Stay (HOSPITAL_COMMUNITY)
Admission: AD | Admit: 2018-04-28 | Discharge: 2018-04-28 | Disposition: A | Discharge status: Home / Self Care | Payer: BLUE CROSS/BLUE SHIELD | Source: Ambulatory Visit | Attending: Obstetrics and Gynecology | Admitting: Obstetrics and Gynecology

## 2018-04-28 DIAGNOSIS — O283 Abnormal ultrasonic finding on antenatal screening of mother: Secondary | ICD-10-CM | POA: Diagnosis not present

## 2018-04-28 DIAGNOSIS — O26891 Other specified pregnancy related conditions, first trimester: Secondary | ICD-10-CM | POA: Insufficient documentation

## 2018-04-28 DIAGNOSIS — N8311 Corpus luteum cyst of right ovary: Secondary | ICD-10-CM | POA: Diagnosis not present

## 2018-04-28 DIAGNOSIS — R1030 Lower abdominal pain, unspecified: Secondary | ICD-10-CM | POA: Diagnosis present

## 2018-04-28 DIAGNOSIS — Z3201 Encounter for pregnancy test, result positive: Secondary | ICD-10-CM | POA: Insufficient documentation

## 2018-04-28 DIAGNOSIS — O3481 Maternal care for other abnormalities of pelvic organs, first trimester: Secondary | ICD-10-CM | POA: Insufficient documentation

## 2018-04-28 DIAGNOSIS — Z3A00 Weeks of gestation of pregnancy not specified: Secondary | ICD-10-CM | POA: Diagnosis not present

## 2018-04-28 DIAGNOSIS — M549 Dorsalgia, unspecified: Secondary | ICD-10-CM | POA: Diagnosis present

## 2018-04-28 DIAGNOSIS — O3680X0 Pregnancy with inconclusive fetal viability, not applicable or unspecified: Secondary | ICD-10-CM | POA: Diagnosis not present

## 2018-04-28 LAB — HCG, QUANTITATIVE, PREGNANCY: HCG, BETA CHAIN, QUANT, S: 187 m[IU]/mL — AB (ref <5)

## 2018-04-28 LAB — POCT PREGNANCY, URINE: Preg Test, Ur: POSITIVE — AB

## 2018-04-28 LAB — CBC
HCT: 37 % (ref 36.0–46.0)
HEMOGLOBIN: 12.6 g/dL (ref 12.0–15.0)
MCH: 28.8 pg (ref 26.0–34.0)
MCHC: 34.1 g/dL (ref 30.0–36.0)
MCV: 84.7 fL (ref 78.0–100.0)
Platelets: 351 10*3/uL (ref 150–400)
RBC: 4.37 MIL/uL (ref 3.87–5.11)
RDW: 12.7 % (ref 11.5–15.5)
WBC: 10.8 10*3/uL — AB (ref 4.0–10.5)

## 2018-04-28 LAB — URINALYSIS, ROUTINE W REFLEX MICROSCOPIC
BILIRUBIN URINE: NEGATIVE
GLUCOSE, UA: NEGATIVE mg/dL
HGB URINE DIPSTICK: NEGATIVE
Ketones, ur: NEGATIVE mg/dL
Leukocytes, UA: NEGATIVE
Nitrite: NEGATIVE
PROTEIN: NEGATIVE mg/dL
SPECIFIC GRAVITY, URINE: 1.023 (ref 1.005–1.030)
pH: 5 (ref 5.0–8.0)

## 2018-04-28 LAB — TYPE AND SCREEN
ABO/RH(D): B POS
Antibody Screen: NEGATIVE

## 2018-04-28 LAB — WET PREP, GENITAL
Clue Cells Wet Prep HPF POC: NONE SEEN
Sperm: NONE SEEN
Trich, Wet Prep: NONE SEEN
Yeast Wet Prep HPF POC: NONE SEEN

## 2018-04-28 LAB — ABO/RH: ABO/RH(D): B POS

## 2018-04-28 NOTE — Discharge Instructions (Signed)
Human Chorionic Gonadotropin Test °Human chorionic gonadotropin (hCG) is a hormone produced during pregnancy by the cells that form the placenta. The placenta is the organ that grows inside your womb (uterus) to nourish a developing baby. When you are pregnant, hCG starts to appear in your blood about 11 days after conception. It continues to go up for the first 8-11 weeks of pregnancy. °Your hCG level can be measured with several different types of tests. You may have: °· A urine test. °? hCG is eliminated from your body by your kidneys, so a urine test is one way to check for this hormone. °? A urine test only shows whether there is hCG in your urine. It does not measure how much. °? You may have a urine test to find out whether you are pregnant. °? A home pregnancy test detects whether there is hCG in your urine. °· A qualitative blood test. °? Like the urine test, this blood test only shows whether there is hCG in your blood. It does not measure how much. °? You may have this type of blood test to find out whether you are pregnant. °· A quantitative blood test. °? This type of blood test measures the amount of hCG in your blood. °? You may have this type of test to diagnose an abnormal pregnancy or determine whether you are at risk of, or have had, a failed pregnancy (miscarriage). ° °How do I prepare for this test? °For the urine test: °· Limit your fluid intake before the urine test as directed by your health care provider. °· Collect the sample the first time you urinate in the morning. °· Let your health care provider know if you have blood in your urine. This may interfere with the test result. ° °Some medicines may interfere with the urine and blood tests. Let your health care provider know about all the medicines you are taking. No additional preparation is required for the blood test. °What do the results mean? °It is your responsibility to obtain your test results. Ask the lab or department performing  the test when and how you will get your results. Talk to your health care provider if you have any questions about your test results. °The results of the hCG urine test and the qualitative hCG blood test are either positive or negative. The results of the quantitative hCG blood test are reported as a number. hCG is measured in international units per liter (IU/L). °Meaning of Negative Test Results °A negative result on a urine or qualitative blood test could mean that you are not pregnant. It could also mean the test was done too early to detect hCG. If you still have other signs of pregnancy, the test should be repeated. °Meaning of Positive Test Results °A positive result on the urine or qualitative blood tests means you are most likely pregnant. Your health care provider may confirm your pregnancy with an imaging study of the inside of your uterus at 5-6 weeks (ultrasound). °Range of Normal Values °Ranges for normal values for the quantitative hCG blood test may vary among different labs and hospitals. You should always check with your health care provider after having lab work or other tests done to discuss whether your values are considered within normal limits. °· Less than 5 IU/L means it is most likely you are not pregnant. °· Greater than 25 IU/L means it is most likely you are pregnant. ° °Meaning of Results Outside Normal Value Ranges °If your hCG   level on the quantitative test is not what would be expected, you may have the test again. It may also be important for your health care provider to know whether your hCG level goes up or down over time. Common causes of results outside the normal range include: °· Being pregnant with twins (hCG level is higher than expected). °· Having an ectopic pregnancy (hCG rises more slowly than expected). °· Miscarriage (hCG level falls). °· Abnormal growths in the womb (hCG level is higher than expected). ° °Talk with your health care provider to discuss your results,  treatment options, and if necessary, the need for more tests. Talk with your health care provider if you have any questions about your results. °This information is not intended to replace advice given to you by your health care provider. Make sure you discuss any questions you have with your health care provider. °Document Released: 11/05/2004 Document Revised: 06/09/2016 Document Reviewed: 01/08/2014 °Elsevier Interactive Patient Education © 2018 [employer] ° °

## 2018-04-28 NOTE — MAU Provider Note (Addendum)
History     CSN: [id number]  Arrival date and time: 04/28/18 1455   First Provider Initiated Contact with Patient 04/28/18 1537      Chief Complaint  Patient presents with  . Abdominal Pain  . Back Pain   HPI  April Malone is a 27 y.o. G2P1001 patient who reports to MAU with chief complaint of severe low abdominal pain in conjunction with a positive pregnancy test the week of July 1. Patient describes the pain as located in her low abdomen/upper pelvis, radiates to her back, rated as "sharp" 10/10, unable to confirm aggravating or alleviating factors.  This is an unplanned pregnancy. Patient states "we're not trying but we're not not trying". Currently sexually active with single partner.   Obstetric history: SVD at 40 weeks in 2008  OB History    Gravida  2   Para  1   Term  1   Preterm      AB      Living  1     SAB      TAB      Ectopic      Multiple      Live Births              Past Medical History:  Diagnosis Date  . [id number](478.2Headache(784.0)     Past Surgical History:  Procedure Laterality Date  . HERNIA REPAIR    . WISDOM TOOTH EXTRACTION      Family History  Problem Relation Age of Onset  . Sickle cell trait Sister   . Asthma Brother   . Allergies Brother   . Other Neg Hx     Social History   Tobacco Use  . Smoking status: Never Smoker  . Smokeless tobacco: Never Used  Substance Use Topics  . Alcohol use: No  . Drug use: No    Allergies: No Known Allergies  Medications Prior to Admission  Medication Sig Dispense Refill Last Dose  . acetaminophen (TYLENOL) 500 MG tablet Take 500 mg by mouth every 6 (six) hours as needed for moderate pain.   12/26/2017 at Unknown time  . norgestrel-ethinyl estradiol (LO/OVRAL,CRYSELLE) 0.3-30 MG-MCG tablet Take 1 tablet by mouth daily. (Patient not taking: Reported on 12/26/2017) 1 Package 11 Not Taking at Unknown time    Review of Systems  Constitutional: Negative for fatigue and fever.   Respiratory: Negative for shortness of breath.   Genitourinary: Positive for pelvic pain. Negative for vaginal bleeding, vaginal discharge and vaginal pain.  Neurological: Positive for headaches. Negative for dizziness.       Occasional headaches, resolve without medication  All other systems reviewed and are negative.  Physical Exam   Blood pressure 130/76, pulse (!) 112, temperature 98 F (36.7 C), temperature source Oral, resp. rate 18, height 5\' 5"  (1.651 m), weight 240 lb 4 oz (109 kg), last menstrual period 03/21/2018, SpO2 99 %.  Physical Exam  Nursing note and vitals reviewed. Constitutional: She is oriented to person, place, and time. She appears well-developed and well-nourished.  HENT:  Head: Normocephalic.  Cardiovascular: Normal rate, normal heart sounds and intact distal pulses.  Respiratory: Effort normal and breath sounds normal.  GI: Soft. Bowel sounds are normal.  Genitourinary: Vagina normal and uterus normal. Cervix exhibits no motion tenderness.  Musculoskeletal: Normal range of motion.  Neurological: She is alert and oriented to person, place, and time. She has normal reflexes.  Skin: Skin is warm and dry.  Psychiatric: She has a normal mood  and affect. Her behavior is normal. Judgment and thought content normal.    MAU Course  Procedures  MDM --Positive pregnancy test in MAU --possible small gestational sac  Patient Vitals for the past 24 hrs:  BP Temp Temp src Pulse Resp SpO2 Height Weight  04/28/18 1900 135/79 - - (!) 106 18 - - -  04/28/18 1524 130/76 98 F (36.7 C) Oral (!) 112 18 99 % - -  04/28/18 1518 - - - - - - 5\' 5"  (1.651 m) 240 lb 4 oz (109 kg)    Orders Placed This Encounter  Procedures  . Wet prep, genital  . US OB LESS THAN 14 WEEKS WITH OB TRANSVAGINAL  . Urinalysis, Routine w reflex microscopic  . CBC  . Pregnancy, urine POC  . Type and screen [facility]   Results for orders placed or performed during the  hospital encounter of 04/28/18 (from the past 24 hour(s))  Pregnancy, urine POC     Status: Abnormal   Collection Time: 04/28/18  3:39 PM  Result Value Ref Range   Preg Test, Ur POSITIVE (A) NEGATIVE  Urinalysis, Routine w reflex microscopic     Status: Abnormal   Collection Time: 04/28/18  3:41 PM  Result Value Ref Range   Color, Urine YELLOW YELLOW   APPearance HAZY (A) CLEAR   Specific Gravity, Urine 1.023 1.005 - 1.030   pH 5.0 5.0 - 8.0   Glucose, UA NEGATIVE NEGATIVE mg/dL   Hgb urine dipstick NEGATIVE NEGATIVE   Bilirubin Urine NEGATIVE NEGATIVE   Ketones, ur NEGATIVE NEGATIVE mg/dL   Protein, ur NEGATIVE NEGATIVE mg/dL   Nitrite NEGATIVE NEGATIVE   Leukocytes, UA NEGATIVE NEGATIVE  Wet prep, genital     Status: Abnormal   Collection Time: 04/28/18  3:53 PM  Result Value Ref Range   Yeast Wet Prep HPF POC NONE SEEN NONE SEEN   Trich, Wet Prep NONE SEEN NONE SEEN   Clue Cells Wet Prep HPF POC NONE SEEN NONE SEEN   WBC, Wet Prep HPF POC FEW (A) NONE SEEN   Sperm NONE SEEN   CBC     Status: Abnormal   Collection Time: 04/28/18  4:06 PM  Result Value Ref Range   WBC 10.8 (H) 4.0 - 10.5 K/uL   RBC 4.37 3.87 - 5.11 MIL/uL   Hemoglobin 12.6 12.0 - 15.0 g/dL   HCT 16.1 09.6 - 04.5 %   MCV 84.7 78.0 - 100.0 fL   MCH 28.8 26.0 - 34.0 pg   MCHC 34.1 30.0 - 36.0 g/dL   RDW 40.9 81.1 - 91.4 %   Platelets 351 150 - 400 K/uL  Type and screen [facility] [place]     Status: None   Collection Time: 04/28/18  4:06 PM  Result Value Ref Range   ABO/RH(D) B POS    Antibody Screen NEG    Sample Expiration      05/01/2018 Performed at [facility], [address], [place], [place] [address]   hCG, quantitative, pregnancy     Status: Abnormal   Collection Time: 04/28/18  4:06 PM  Result Value Ref Range   hCG, Beta Chain, Quant, S 187 (H) <5 mIU/mL   US Ob Less Than 14 Weeks With Ob Transvaginal  Result Date: 04/28/2018 CLINICAL DATA:  Initial evaluation for  pregnancy of unknown anatomic location. EXAM: OBSTETRIC <14 WK [place] AND TRANSVAGINAL OB US TECHNIQUE: Both transabdominal and transvaginal ultrasound examinations were performed for complete  evaluation of the gestation as well as the maternal uterus, adnexal regions, and pelvic cul-de-sac. Transvaginal technique was performed to assess early pregnancy. COMPARISON:  None. FINDINGS: Intrauterine gestational sac: Possible tiny early gestational sac measuring 2 mm noted within the endometrial cavity. Yolk sac:  Negative. Embryo:  Negative. Cardiac Activity: N/A Heart Rate: N/A  bpm MSD: 0.16 mm.  Small size out of range for dating. Subchorionic hemorrhage:  None visualized. Maternal uterus/adnexae: Ovaries within normal limits bilaterally. Corpus luteal cyst noted on the right. Small volume free fluid noted within the left adnexa. IMPRESSION: 1. Possible early intrauterine gestational sac, but no yolk sac, fetal pole, or cardiac activity yet visualized. Recommend follow-up quantitative B-HCG levels and follow-up US in 14 days to assess viability. This recommendation follows SRU consensus guidelines: Diagnostic Criteria for Nonviable Pregnancy Early in the First Trimester. Malva Limes Med 2013; 161:0960-45. 2. No other acute maternal uterine or adnexal abnormality. No adnexal mass. Electronically Signed   By: Rise Mu M.D.   On: 04/28/2018 18:24     Assessment and Plan  --Pregnancy of unknown location in G2P1001 at 5w 2d by LMP --Reviewed general obstetric precautions including but not limited to falls, fever, vaginal bleeding, recurrence of     sharp abdominal pain --Follow up hCG Quant scheduled for Monday at 1:30pm [facility] --Discharge home in stable condition   Calvert Cantor, CNM 04/28/2018, 7:09 PM

## 2018-04-28 NOTE — MAU Note (Signed)
Pt presents with c/o lower back pain & abdominal cramping that began Wednesday night.  Denies VB.  Reports +UPT x2.   LMP 03/21/2018.

## 2018-05-01 ENCOUNTER — Ambulatory Visit (INDEPENDENT_AMBULATORY_CARE_PROVIDER_SITE_OTHER): Payer: BLUE CROSS/BLUE SHIELD | Admitting: General Practice

## 2018-05-01 ENCOUNTER — Encounter: Payer: Self-pay | Admitting: General Practice

## 2018-05-01 DIAGNOSIS — O3680X0 Pregnancy with inconclusive fetal viability, not applicable or unspecified: Secondary | ICD-10-CM

## 2018-05-01 DIAGNOSIS — O283 Abnormal ultrasonic finding on antenatal screening of mother: Secondary | ICD-10-CM

## 2018-05-01 LAB — GC/CHLAMYDIA PROBE AMP (~~LOC~~) NOT AT ARMC
Chlamydia: NEGATIVE
NEISSERIA GONORRHEA: NEGATIVE

## 2018-05-01 LAB — HCG, QUANTITATIVE, PREGNANCY: HCG, BETA CHAIN, QUANT, S: 321 m[IU]/mL — AB (ref <5)

## 2018-05-01 NOTE — Telephone Encounter (Signed)
Patient was seen in the office on 05/01/2018 for stat hcg

## 2018-05-01 NOTE — Progress Notes (Signed)
Patient presents to office today for stat bhcg. Patient denies pain or bleeding. Discussed with patient we are monitoring her bhcg levels today & asked she wait in lobby for results/updated plan of care. Patient verbalized understanding & had no questions at this time . Reviewed results with Thressa ShellerHeather Hogan who finds appropriate rise in bhcg levels- patient needs follow up ultrasound in 3 weeks. Scheduled 8/6 @ 1pm.  Informed patient of results & scheduled ultrasound appt. Patient verbalized understanding and had no questions at this time.

## 2018-05-01 NOTE — Progress Notes (Signed)
I have reviewed the chart and agree with nursing staff's documentation of this patient's encounter.  Thressa ShellerHeather Hogan, CNM 05/01/2018 5:18 PM

## 2018-05-03 ENCOUNTER — Other Ambulatory Visit: Payer: Self-pay | Admitting: Obstetrics and Gynecology

## 2018-05-05 ENCOUNTER — Ambulatory Visit: Payer: BLUE CROSS/BLUE SHIELD | Admitting: Obstetrics & Gynecology

## 2018-05-06 ENCOUNTER — Encounter (HOSPITAL_COMMUNITY): Payer: Self-pay | Admitting: *Deleted

## 2018-05-06 ENCOUNTER — Inpatient Hospital Stay (HOSPITAL_COMMUNITY)
Admission: AD | Admit: 2018-05-06 | Discharge: 2018-05-07 | Disposition: A | Discharge status: Home / Self Care | Payer: BLUE CROSS/BLUE SHIELD | Source: Ambulatory Visit | Attending: Obstetrics | Admitting: Obstetrics

## 2018-05-06 ENCOUNTER — Inpatient Hospital Stay (HOSPITAL_COMMUNITY): Payer: BLUE CROSS/BLUE SHIELD

## 2018-05-06 DIAGNOSIS — O209 Hemorrhage in early pregnancy, unspecified: Secondary | ICD-10-CM

## 2018-05-06 DIAGNOSIS — Z3A01 Less than 8 weeks gestation of pregnancy: Secondary | ICD-10-CM | POA: Diagnosis not present

## 2018-05-06 DIAGNOSIS — O3680X0 Pregnancy with inconclusive fetal viability, not applicable or unspecified: Secondary | ICD-10-CM

## 2018-05-06 LAB — CBC
HEMATOCRIT: 34.5 % — AB (ref 36.0–46.0)
HEMOGLOBIN: 11.7 g/dL — AB (ref 12.0–15.0)
MCH: 29.2 pg (ref 26.0–34.0)
MCHC: 33.9 g/dL (ref 30.0–36.0)
MCV: 86 fL (ref 78.0–100.0)
Platelets: 295 10*3/uL (ref 150–400)
RBC: 4.01 MIL/uL (ref 3.87–5.11)
RDW: 12.7 % (ref 11.5–15.5)
WBC: 13 10*3/uL — ABNORMAL HIGH (ref 4.0–10.5)

## 2018-05-06 LAB — HCG, QUANTITATIVE, PREGNANCY: HCG, BETA CHAIN, QUANT, S: 168 m[IU]/mL — AB (ref <5)

## 2018-05-06 LAB — URINALYSIS, ROUTINE W REFLEX MICROSCOPIC
Bilirubin Urine: NEGATIVE
Glucose, UA: NEGATIVE mg/dL
Ketones, ur: NEGATIVE mg/dL
Nitrite: NEGATIVE
PH: 6 (ref 5.0–8.0)
Protein, ur: 30 mg/dL — AB
RBC / HPF: >50 RBC/hpf — ABNORMAL HIGH (ref 0–5)
SPECIFIC GRAVITY, URINE: 1.013 (ref 1.005–1.030)

## 2018-05-06 NOTE — MAU Provider Note (Signed)
Chief Complaint: Abdominal Pain; Vaginal Bleeding; and Back Pain   First Provider Initiated Contact with Patient 05/06/18 2251     SUBJECTIVE HPI: April Malone is a 27 y.o. G2P1001 at [redacted]w[redacted]d who presents to [facility] reporting vaginal bleeding, abdominal pain, and back pain.  Went to [facility] on Thursday for labs but doesn't know what the results were.  New onset of vaginal bleeding this morning. States pink/red on toilet paper. Has started to see some blood in pad but not saturating pads or passing clots.   Location: low abdomen & low back Quality: cramp like & stabbing Severity: 9/10 on pain scale Duration: today Timing: intermittent Modifying factors: nothing makes better or worse. Has not treated symptoms.  Associated signs and symptoms: vaginal bleeding  Past Medical History:  Diagnosis Date  . Headache(784.0)    OB History  Gravida Para Term Preterm AB Living  2 1 1     1   SAB TAB Ectopic Multiple Live Births               # Outcome Date GA Lbr Len/2nd Weight Sex Delivery Anes PTL Lv  2 Current           1 Term 08/07/07 [redacted]w[redacted]d  6 lb 12 oz (3.062 kg) F Vag-Spont      Past Surgical History:  Procedure Laterality Date  . HERNIA REPAIR    . WISDOM TOOTH EXTRACTION     Social History   Socioeconomic History  . Marital status: Single    Spouse name: Not on file  . Number of children: Not on file  . Years of education: Not on file  . Highest education level: Not on file  Occupational History  . Not on file  Social Needs  . Financial resource strain: Not on file  . Food insecurity:    Worry: Not on file    Inability: Not on file  . Transportation needs:    Medical: Not on file    Non-medical: Not on file  Tobacco Use  . Smoking status: Never Smoker  . Smokeless tobacco: Never Used  Substance and Sexual Activity  . Alcohol use: No  . Drug use: No  . Sexual activity: Yes    Birth control/protection: None  Lifestyle  . Physical activity:    Days  per week: Not on file    Minutes per session: Not on file  . Stress: Not on file  Relationships  . Social connections:    Talks on phone: Not on file    Gets together: Not on file    Attends religious service: Not on file    Active member of club or organization: Not on file    Attends meetings of clubs or organizations: Not on file    Relationship status: Not on file  . Intimate partner violence:    Fear of current or ex partner: Not on file    Emotionally abused: Not on file    Physically abused: Not on file    Forced sexual activity: Not on file  Other Topics Concern  . Not on file  Social History Narrative  . Not on file   Family History  Problem Relation Age of Onset  . Sickle cell trait Sister   . Asthma Brother   . Allergies Brother   . Other Neg Hx    No current facility-administered medications on file prior to encounter.    Current Outpatient Medications on File Prior to Encounter  Medication  Sig Dispense Refill  . acetaminophen (TYLENOL) 500 MG tablet Take 500 mg by mouth every 6 (six) hours as needed for moderate pain.    . Prenatal Vit-Fe Fumarate-FA (PRENATAL MULTIVITAMIN) TABS tablet Take 1 tablet by mouth daily at 12 noon.     No Known Allergies  I have reviewed patient's Past Medical Hx, Surgical Hx, Family Hx, Social Hx, medications and allergies.   Review of Systems  Constitutional: Negative.   Gastrointestinal: Positive for abdominal pain.  Genitourinary: Positive for vaginal bleeding.  Musculoskeletal: Positive for back pain.    OBJECTIVE Patient Vitals for the past 24 hrs:  BP Temp Pulse Resp Height Weight  05/06/18 2227 (!) 118/58 98.3 F (36.8 C) 95 18 5\' 5"  (1.651 m) 240 lb (108.9 kg)   Constitutional: Well-developed, well-nourished female in no acute distress.  Cardiovascular: normal rate Respiratory: normal rate and effort.  GI: Abd soft, non-tender, gravid appropriate for gestational age. Pos BS x 4 MS: Extremities nontender, no  edema, normal ROM Neurologic: Alert and oriented x 4.  GU: Neg CVAT.  SPECULUM EXAM: NEFG, small amount of dark red mucoid blood  BIMANUAL: cervix closed; uterus normal size, no adnexal tenderness or masses.  No CMT.  LAB RESULTS Results for orders placed or performed during the hospital encounter of 05/06/18 (from the past 24 hour(s))  Urinalysis, Routine w reflex microscopic     Status: Abnormal   Collection Time: 05/06/18 10:51 PM  Result Value Ref Range   Color, Urine YELLOW YELLOW   APPearance CLOUDY (A) CLEAR   Specific Gravity, Urine 1.013 1.005 - 1.030   pH 6.0 5.0 - 8.0   Glucose, UA NEGATIVE NEGATIVE mg/dL   Hgb urine dipstick LARGE (A) NEGATIVE   Bilirubin Urine NEGATIVE NEGATIVE   Ketones, ur NEGATIVE NEGATIVE mg/dL   Protein, ur 30 (A) NEGATIVE mg/dL   Nitrite NEGATIVE NEGATIVE   Leukocytes, UA SMALL (A) NEGATIVE   RBC / HPF >50 (H) 0 - 5 RBC/hpf   WBC, UA 21-50 0 - 5 WBC/hpf   Bacteria, UA RARE (A) NONE SEEN   Squamous Epithelial / LPF 6-10 0 - 5   Mucus PRESENT   CBC     Status: Abnormal   Collection Time: 05/06/18 11:03 PM  Result Value Ref Range   WBC 13.0 (H) 4.0 - 10.5 K/uL   RBC 4.01 3.87 - 5.11 MIL/uL   Hemoglobin 11.7 (L) 12.0 - 15.0 g/dL   HCT 69.6 (L) 29.5 - 28.4 %   MCV 86.0 78.0 - 100.0 fL   MCH 29.2 26.0 - 34.0 pg   MCHC 33.9 30.0 - 36.0 g/dL   RDW 13.2 44.0 - 10.2 %   Platelets 295 150 - 400 K/uL  hCG, quantitative, pregnancy     Status: Abnormal   Collection Time: 05/06/18 11:03 PM  Result Value Ref Range   hCG, Beta Chain, Quant, S 168 (H) <5 mIU/mL    IMAGING US Ob Transvaginal  Result Date: 05/07/2018 CLINICAL DATA:  Initial evaluation for vaginal bleeding. Early pregnancy. EXAM: OBSTETRIC <14 WK [place] AND TRANSVAGINAL OB US TECHNIQUE: Both transabdominal and transvaginal ultrasound examinations were performed for complete evaluation of the gestation as well as the maternal uterus, adnexal regions, and pelvic cul-de-sac. Transvaginal  technique was performed to assess early pregnancy. COMPARISON:  Prior ultrasound from 04/28/2017. FINDINGS: Intrauterine gestational sac: Not visualized. Yolk sac:  Negative. Embryo:  Negative. Cardiac Activity: N/A Heart Rate: N/A bpm Subchorionic hemorrhage:  None visualized. Maternal uterus/adnexae: Ovaries normal  in appearance bilaterally. Small volume free fluid present within the pelvis. IMPRESSION: 1. Early pregnancy with no discrete IUP or adnexal mass identified. Finding is consistent with a pregnancy of unknown anatomic location. Differential considerations include IUP to early to visualize, recent SAB, or possibly occult ectopic pregnancy. Close clinical monitoring with serial beta HCGs and close interval follow-up ultrasound recommended as clinically warranted. 2. No other acute maternal uterine or adnexal abnormality identified. Electronically Signed   By: Rise MuBenjamin  McClintock M.D.   On: 05/07/2018 00:24     MAU COURSE Orders Placed This Encounter  Procedures  . US OB Transvaginal  . Urinalysis, Routine w reflex microscopic  . CBC  . hCG, quantitative, pregnancy   No orders of the defined types were placed in this encounter.   MDM Maternal blood type B positive HCG down to 168 from 321 (6 days ago). Have not yet documented IUP on ultrasound with this pregnancy. Likely miscarriage based on exam today and dropping HCG but can't completely exclude possibility of ectopic pregnancy.   C/w Dr. Ernestina PennaFogleman. Will have patient call office on Monday for f/u HCG  ASSESSMENT 1. Pregnancy of unknown anatomic location   2. Vaginal bleeding in pregnancy, first trimester     PLAN Discharge home in stable condition. SAB vs ectopic precautions     Allergies as of 05/07/2018   No Known Allergies     Medication List    TAKE these medications   acetaminophen 500 MG tablet Commonly known as:  TYLENOL Take 500 mg by mouth every 6 (six) hours as needed for moderate pain.   prenatal  multivitamin Tabs tablet Take 1 tablet by mouth daily at 12 noon.        Judeth HornLawrence, Shelba Susi, NP 05/06/2018  11:48 PM

## 2018-05-06 NOTE — MAU Note (Signed)
Thur and Friday had some pain in lower back. This morning saw some pink when I wiped. Tonight went to [facility] and saw white/pink like this d/c on panties. Some discomfort in upper abd and still having back pain

## 2018-05-07 NOTE — Progress Notes (Signed)
ERin Lawrence NP in earlier to discuss test results and d/c plan with pt. Written and verbal d/c instructions given and understanding voiced. 

## 2018-05-07 NOTE — Discharge Instructions (Signed)
Return to care  °· If you have heavier bleeding that soaks through more that 2 pads per hour for an hour or more °· If you bleed so much that you feel like you might pass out or you do pass out °· If you have significant abdominal pain that is not improved with Tylenol  °· If you develop a fever > 100.5 ° °

## 2018-05-08 ENCOUNTER — Other Ambulatory Visit: Payer: Self-pay | Admitting: Obstetrics and Gynecology

## 2018-05-08 LAB — CULTURE, OB URINE: Culture: <10000 — AB

## 2018-05-23 ENCOUNTER — Ambulatory Visit (HOSPITAL_COMMUNITY): Payer: BLUE CROSS/BLUE SHIELD

## 2018-05-23 ENCOUNTER — Ambulatory Visit (HOSPITAL_COMMUNITY): Payer: Medicaid Other

## 2018-06-08 ENCOUNTER — Other Ambulatory Visit: Payer: Self-pay | Admitting: Obstetrics and Gynecology

## 2018-07-10 ENCOUNTER — Telehealth: Payer: Self-pay | Admitting: General Practice

## 2018-07-10 NOTE — Telephone Encounter (Signed)
Patient called & left voicemail message with no information other than her name & phone number and requesting a phone call. Called patient, no answer- left message stating we are trying to reach you to return your phone call, please call us back or send us a mychart message if you need assistance.

## 2018-09-21 ENCOUNTER — Other Ambulatory Visit: Payer: Self-pay | Admitting: Obstetrics and Gynecology

## 2018-10-18 NOTE — L&D Delivery Note (Signed)
Delivery Note   Patient Name: April Malone DOB: 1991/01/29 MRN: [id number]  Date of admission: 06/15/2019 Delivering MD: Noralyn Pick  Date of delivery: 06/15/19 Type of delivery: SVD  Newborn Data: Live born female  Birth Weight:   APGAR: 34, 23  Newborn Delivery   Birth date/time: 06/15/2019 05:43:00 Delivery type: Vaginal, Spontaneous     [name], 28 y.o., @ [redacted]w[redacted]d,  G3P1011, who was admitted for Early latent labor.. I was called to the room when she progressed 5-6cm, pt was sitting up on the side of the bed waiting for epidural, then started involuntarily pushing, I laid the pt back and she was 9 cm, on next contraction she was a rim and it was reduced with gentle pressure, pt then was at +2 station in the second stage of labor.  She pushed for 15/min.  She delivered a viable infant, cephalic and restituted to the LOA position over an intact perineum.  A tight nuchal cord   was identified, and one lose body cord, unable to reduce the cord, NB was saumersaulted through. The baby was placed on maternal abdomen while initial step of NRP were perfmored (Dry, Stimulated, and warmed). Hat placed on baby for thermoregulation. Delayed cord clamping was performed for 1.5 minutes.  Cord double clamped and cut.  Cord cut by father. Apgar scores were 6, the newborn was taken to radiant warmer for further evaluation , given stimulation and bulb suction, newborn then pinked up and 9 was the 5 mins apgar. Prophylactic Pitocin was started in the third stage of labor for active management. The placenta delivered spontaneously, shultz, with a 3 vessel cord and was sent to LD.  Inspection revealed left labial tear, hemostatic and no repair required. An examination of the vaginal vault and cervix was free from lacerations. The uterus was firm, bleeding stable.   Placenta and umbilical artery blood gas were not sent.  There were no complications during the procedure.  Mom and baby skin to skin following  delivery. Left in stable condition.  Maternal Info: Anesthesia: IV pain medication, last given at 0502 193mcg fentanyl Episiotomy: no Lacerations:  left labial tear  Suture Repair: no Est. Blood Loss (mL):  101  Newborn Info:  Baby Sex: female Circumcision: in pt desired Babies Name: marquez APGAR (1 MIN): 6   APGAR (5 MINS): 9   APGAR (10 MINS):     Mom to postpartum.  Baby to Couplet care / Skin to Skin.  Dr Mancel Bale updated on birth.   [name], [place], NP-C 06/15/19 6:21 AM

## 2018-10-25 ENCOUNTER — Inpatient Hospital Stay (HOSPITAL_COMMUNITY)
Admission: AD | Admit: 2018-10-25 | Discharge: 2018-10-25 | Disposition: A | Discharge status: Home / Self Care | Payer: Medicaid Other | Attending: Obstetrics and Gynecology | Admitting: Obstetrics and Gynecology

## 2018-10-25 ENCOUNTER — Encounter (HOSPITAL_COMMUNITY): Payer: Self-pay

## 2018-10-25 ENCOUNTER — Inpatient Hospital Stay (HOSPITAL_COMMUNITY): Payer: Medicaid Other

## 2018-10-25 DIAGNOSIS — Z3491 Encounter for supervision of normal pregnancy, unspecified, first trimester: Secondary | ICD-10-CM

## 2018-10-25 DIAGNOSIS — O219 Vomiting of pregnancy, unspecified: Secondary | ICD-10-CM

## 2018-10-25 DIAGNOSIS — O210 Mild hyperemesis gravidarum: Secondary | ICD-10-CM | POA: Insufficient documentation

## 2018-10-25 DIAGNOSIS — Z3A01 Less than 8 weeks gestation of pregnancy: Secondary | ICD-10-CM | POA: Diagnosis not present

## 2018-10-25 DIAGNOSIS — R109 Unspecified abdominal pain: Secondary | ICD-10-CM

## 2018-10-25 DIAGNOSIS — O26891 Other specified pregnancy related conditions, first trimester: Secondary | ICD-10-CM

## 2018-10-25 DIAGNOSIS — O9989 Other specified diseases and conditions complicating pregnancy, childbirth and the puerperium: Secondary | ICD-10-CM

## 2018-10-25 DIAGNOSIS — M549 Dorsalgia, unspecified: Secondary | ICD-10-CM

## 2018-10-25 DIAGNOSIS — O99891 Other specified diseases and conditions complicating pregnancy: Secondary | ICD-10-CM

## 2018-10-25 LAB — WET PREP, GENITAL
Clue Cells Wet Prep HPF POC: NONE SEEN
Sperm: NONE SEEN
Trich, Wet Prep: NONE SEEN
Yeast Wet Prep HPF POC: NONE SEEN

## 2018-10-25 LAB — URINALYSIS, ROUTINE W REFLEX MICROSCOPIC
Bilirubin Urine: NEGATIVE
GLUCOSE, UA: NEGATIVE mg/dL
HGB URINE DIPSTICK: NEGATIVE
KETONES UR: NEGATIVE mg/dL
LEUKOCYTES UA: NEGATIVE
Nitrite: NEGATIVE
PH: 5 (ref 5.0–8.0)
PROTEIN: NEGATIVE mg/dL
Specific Gravity, Urine: 1.023 (ref 1.005–1.030)

## 2018-10-25 LAB — CBC
HCT: 37.2 % (ref 36.0–46.0)
Hemoglobin: 12.7 g/dL (ref 12.0–15.0)
MCH: 29.2 pg (ref 26.0–34.0)
MCHC: 34.1 g/dL (ref 30.0–36.0)
MCV: 85.5 fL (ref 80.0–100.0)
Platelets: 308 10*3/uL (ref 150–400)
RBC: 4.35 MIL/uL (ref 3.87–5.11)
RDW: 12.7 % (ref 11.5–15.5)
WBC: 12 10*3/uL — ABNORMAL HIGH (ref 4.0–10.5)
nRBC: 0 % (ref 0.0–0.2)

## 2018-10-25 LAB — POCT PREGNANCY, URINE: Preg Test, Ur: POSITIVE — AB

## 2018-10-25 LAB — HIV ANTIBODY (ROUTINE TESTING W REFLEX): HIV SCREEN 4TH GENERATION: NONREACTIVE

## 2018-10-25 LAB — HCG, QUANTITATIVE, PREGNANCY: hCG, Beta Chain, Quant, S: 167658 m[IU]/mL — ABNORMAL HIGH (ref <5)

## 2018-10-25 NOTE — MAU Provider Note (Signed)
Chief Complaint: Back Pain and Nausea   First Provider Initiated Contact with Patient 10/25/18 [phone]      SUBJECTIVE HPI: April Malone is a [age] y.o. G2P1011 at 6060w5d by LMP who presents to maternity admissions reporting back pain in her mid lower back and nausea x 2 days.  The back pain is constant burning pain but it does intermittently radiate to her lower abdomen.  The nausea is intermittent, there is vomiting 2-3 times/day and it is improved with use of a seaband bracelet.  She has not tried any treatments for her symptoms. She had a miscarriage in July of 2019 and wants to get things checked out.  There are no other symptoms and she has not tried any other treatments.   HPI  Past Medical History:  Diagnosis Date  . [id number](956.2Headache(784.0)    Past Surgical History:  Procedure Laterality Date  . HERNIA REPAIR    . WISDOM TOOTH EXTRACTION     Social History   Socioeconomic History  . Marital status: Single    Spouse name: Not on file  . Number of children: Not on file  . Years of education: Not on file  . Highest education level: Not on file  Occupational History  . Not on file  Social Needs  . Financial resource strain: Not on file  . Food insecurity:    Worry: Not on file    Inability: Not on file  . Transportation needs:    Medical: Not on file    Non-medical: Not on file  Tobacco Use  . Smoking status: Never Smoker  . Smokeless tobacco: Never Used  Substance and Sexual Activity  . Alcohol use: No  . Drug use: No  . Sexual activity: Yes    Birth control/protection: None  Lifestyle  . Physical activity:    Days per week: Not on file    Minutes per session: Not on file  . Stress: Not on file  Relationships  . Social connections:    Talks on phone: Not on file    Gets together: Not on file    Attends religious service: Not on file    Active member of club or organization: Not on file    Attends meetings of clubs or organizations: Not on file    Relationship status:  Not on file  . Intimate partner violence:    Fear of current or ex partner: Not on file    Emotionally abused: Not on file    Physically abused: Not on file    Forced sexual activity: Not on file  Other Topics Concern  . Not on file  Social History Narrative  . Not on file   No current facility-administered medications on file prior to encounter.    Current Outpatient Medications on File Prior to Encounter  Medication Sig Dispense Refill  . acetaminophen (TYLENOL) 500 MG tablet Take 500 mg by mouth every 6 (six) hours as needed for moderate pain.    . Prenatal Vit-Fe Fumarate-FA (PRENATAL MULTIVITAMIN) TABS tablet Take 1 tablet by mouth daily at 12 noon.     No Known Allergies  ROS:  Review of Systems  Constitutional: Negative for chills, fatigue and fever.  Respiratory: Negative for shortness of breath.   Cardiovascular: Negative for chest pain.  Gastrointestinal: Positive for abdominal pain, nausea and vomiting.  Genitourinary: Positive for pelvic pain. Negative for difficulty urinating, dysuria, flank pain, vaginal bleeding, vaginal discharge and vaginal pain.  Musculoskeletal: Positive for back pain.  Neurological: Negative for dizziness and headaches.  Psychiatric/Behavioral: Negative.      I have reviewed patient's Past Medical Hx, Surgical Hx, Family Hx, Social Hx, medications and allergies.   Physical Exam   Patient Vitals for the past 24 hrs:  BP Pulse Resp  10/25/18 1107 125/77 92 18   Constitutional: Well-developed, well-nourished female in no acute distress.  Cardiovascular: normal rate Respiratory: normal effort GI: Abd soft, non-tender. Pos BS x 4 MS: Extremities nontender, no edema, normal ROM Neurologic: Alert and oriented x 4.  GU: Neg CVAT.  PELVIC EXAM: Cervix pink, visually closed, without lesion, scant white creamy discharge, vaginal walls and external genitalia normal Bimanual exam: Cervix 0/long/high, firm, anterior, neg CMT, uterus nontender,  nonenlarged, adnexa without tenderness, enlargement, or mass  LAB RESULTS Results for orders placed or performed during the hospital encounter of 10/25/18 (from the past 24 hour(s))  Wet prep, genital     Status: Abnormal   Collection Time: 10/25/18  9:34 AM  Result Value Ref Range   Yeast Wet Prep HPF POC NONE SEEN NONE SEEN   Trich, Wet Prep NONE SEEN NONE SEEN   Clue Cells Wet Prep HPF POC NONE SEEN NONE SEEN   WBC, Wet Prep HPF POC FEW (A) NONE SEEN   Sperm NONE SEEN     --/--/B POS Performed at [facility], [address] [place], [place] [address]  [phone])  IMAGING US Ob Less Than 14 Weeks With Ob Transvaginal  Result Date: 10/25/2018 CLINICAL DATA:  First trimester of pregnancy, abdominal pain. EXAM: OBSTETRIC <14 WK [place] AND TRANSVAGINAL OB US TECHNIQUE: Both transabdominal and transvaginal ultrasound examinations were performed for complete evaluation of the gestation as well as the maternal uterus, adnexal regions, and pelvic cul-de-sac. Transvaginal technique was performed to assess early pregnancy. COMPARISON:  Ultrasound of May 06, 2018. FINDINGS: Intrauterine gestational sac: Single visualized. Yolk sac:  Visualized. Embryo:  Visualized. Cardiac Activity: Visualized. Heart Rate: 157 bpm CRL:  12.62 mm   7 w   3 d                  [place] EDC: June 10, 2019. Subchorionic hemorrhage:  None visualized. Maternal uterus/adnexae: Ovaries are unremarkable. No free fluid is noted. IMPRESSION: Single live intrauterine gestation of 7 weeks 3 days. Electronically Signed   By: Lupita Raider, M.D.   On: 10/25/2018 10:20    MAU Management/MDM: Orders Placed This Encounter  Procedures  . Wet prep, genital  . US OB LESS THAN 14 WEEKS WITH OB TRANSVAGINAL  . Urinalysis, Routine w reflex microscopic  . CBC  . hCG, quantitative, pregnancy  . Pregnancy, urine POC  . Discharge patient    No orders of the defined types were placed in this encounter.   US shows live IUP without  complication.  Pt pain is likely musculoskeletal from vomiting and/or work related.  Rest/ice/heat/warm bath/Tylenol for pain.  Pt declines nausea medication, will pick up Rx sent by her doctor if needed.  Keep scheduled appts in office, return to MAU with emergencies.  Pt discharged with strict return precautions.  ASSESSMENT 1. Normal IUP (intrauterine pregnancy) on prenatal ultrasound, first trimester   2. Abdominal pain during pregnancy in first trimester   3. Nausea and vomiting during pregnancy prior to [redacted] weeks gestation   4. Back pain affecting pregnancy in first trimester     PLAN Discharge home Allergies as of 10/25/2018   No Known Allergies     Medication  List    TAKE these medications   acetaminophen 500 MG tablet Commonly known as:  TYLENOL Take 500 mg by mouth every 6 (six) hours as needed for moderate pain.   prenatal multivitamin Tabs tablet Take 1 tablet by mouth daily at 12 noon.      Follow-up Information    Ob/Gyn, [facility] Follow up.   Why:  As scheduled. Return to MAU as needed for emergencies. Contact information: [address] [place] [address] [place] [address] [phone]           Sharen Counter Certified Nurse-Midwife 10/26/2018  9:27 AM

## 2018-10-25 NOTE — MAU Note (Signed)
Lower back that started on Monday, tried tylenol and ice packs with no relief. Pain is intermittent  No bleeding, no vaginal discharge  Has has N/V, emesis x2 in the past 24 hours  LMP 09/01/18  + UPT on Dec 14 and has been confirmed at doctors office

## 2018-10-26 LAB — GC/CHLAMYDIA PROBE AMP (~~LOC~~) NOT AT ARMC
CHLAMYDIA, DNA PROBE: NEGATIVE
Neisseria Gonorrhea: NEGATIVE

## 2018-10-27 ENCOUNTER — Encounter: Payer: Self-pay | Admitting: Obstetrics

## 2018-11-17 ENCOUNTER — Encounter: Payer: Medicaid Other | Admitting: Medical

## 2018-11-17 DIAGNOSIS — Z3480 Encounter for supervision of other normal pregnancy, unspecified trimester: Secondary | ICD-10-CM | POA: Insufficient documentation

## 2018-12-14 LAB — OB RESULTS CONSOLE GC/CHLAMYDIA
Chlamydia: NEGATIVE
Gonorrhea: NEGATIVE

## 2018-12-14 LAB — OB RESULTS CONSOLE RUBELLA ANTIBODY, IGM: Rubella: NON-IMMUNE/NOT IMMUNE

## 2018-12-14 LAB — OB RESULTS CONSOLE HIV ANTIBODY (ROUTINE TESTING): HIV: NONREACTIVE

## 2018-12-14 LAB — OB RESULTS CONSOLE ANTIBODY SCREEN: Antibody Screen: NEGATIVE

## 2018-12-14 LAB — OB RESULTS CONSOLE ABO/RH: RH Type: POSITIVE

## 2018-12-14 LAB — OB RESULTS CONSOLE RPR: RPR: NONREACTIVE

## 2018-12-14 LAB — OB RESULTS CONSOLE HEPATITIS B SURFACE ANTIGEN: Hepatitis B Surface Ag: NEGATIVE

## 2019-04-29 ENCOUNTER — Other Ambulatory Visit: Payer: Self-pay

## 2019-04-29 ENCOUNTER — Encounter (HOSPITAL_COMMUNITY): Payer: Self-pay

## 2019-04-29 ENCOUNTER — Inpatient Hospital Stay (HOSPITAL_COMMUNITY)
Admission: AD | Admit: 2019-04-29 | Discharge: 2019-04-29 | Disposition: A | Discharge status: Home / Self Care | Payer: Medicaid Other | Attending: Obstetrics and Gynecology | Admitting: Obstetrics and Gynecology

## 2019-04-29 DIAGNOSIS — R102 Pelvic and perineal pain: Secondary | ICD-10-CM | POA: Diagnosis not present

## 2019-04-29 DIAGNOSIS — Z3A34 34 weeks gestation of pregnancy: Secondary | ICD-10-CM | POA: Insufficient documentation

## 2019-04-29 DIAGNOSIS — M545 Low back pain: Secondary | ICD-10-CM | POA: Insufficient documentation

## 2019-04-29 DIAGNOSIS — O9989 Other specified diseases and conditions complicating pregnancy, childbirth and the puerperium: Secondary | ICD-10-CM | POA: Diagnosis not present

## 2019-04-29 DIAGNOSIS — O99891 Other specified diseases and conditions complicating pregnancy: Secondary | ICD-10-CM

## 2019-04-29 DIAGNOSIS — O26899 Other specified pregnancy related conditions, unspecified trimester: Secondary | ICD-10-CM

## 2019-04-29 DIAGNOSIS — O26893 Other specified pregnancy related conditions, third trimester: Secondary | ICD-10-CM | POA: Diagnosis not present

## 2019-04-29 DIAGNOSIS — M549 Dorsalgia, unspecified: Secondary | ICD-10-CM

## 2019-04-29 LAB — URINALYSIS, ROUTINE W REFLEX MICROSCOPIC
Bilirubin Urine: NEGATIVE
Glucose, UA: NEGATIVE mg/dL
Hgb urine dipstick: NEGATIVE
Ketones, ur: NEGATIVE mg/dL
Leukocytes,Ua: NEGATIVE
Nitrite: NEGATIVE
Protein, ur: NEGATIVE mg/dL
Specific Gravity, Urine: 1.011 (ref 1.005–1.030)
pH: 7 (ref 5.0–8.0)

## 2019-04-29 MED ORDER — COMFORT FIT MATERNITY SUPP LG MISC: 1.0000 [IU] | Freq: Every day | 0 refills | Status: DC

## 2019-04-29 NOTE — MAU Note (Signed)
April Malone is a 28 y.o. at [redacted]w[redacted]d here in MAU reporting: lower back pain since Thursday. States since Friday shes been having pain in her pelvis, states when shes up and walking shes having lots of pressure and it feels like somebody is pulling. Tried tylenol with no relief. No bleeding, no LOF. Reports she is feeling FM but it is less than normal.   Onset of complaint: since Thursday  Pain score: back pain 7/10, pelvic pain 10/10  Vitals:   04/29/19 1053  BP: 128/74  Pulse: (!) 101  Resp: 16  Temp: 99.6 F (37.6 C)  SpO2: 100%      Lab orders placed from triage: UA

## 2019-04-29 NOTE — MAU Provider Note (Signed)
Chief Complaint:  Back Pain and Pelvic Pain   First Provider Initiated Contact with Patient 04/29/19 1115     HPI: April Malone is a 28 y.o. G3P1011 at [redacted]w[redacted]d who presents to maternity admissions reporting low back pain & pelvic pain. Symptoms started on Thursday. Has tried tylenol without relief. Has a maternity support belt at home from the beginning of the pregnancy but reports that it is too small so she doesn't wear it.  Pain is worse with walking & movement. Denies contractions, leakage of fluid or vaginal bleeding. Good fetal movement.  Location: low back & pelvis Quality: pressure, sharp, aching Severity: 8/10 in pain scale Duration: since Thursday Timing: constant Modifying factors: worse with walking & standing. Not improved with tylenol Associated signs and symptoms: none    Past Medical History:  Diagnosis Date  . Headache(784.0)    OB History  Gravida Para Term Preterm AB Living  3 1 1   1 1   SAB TAB Ectopic Multiple Live Births  1            # Outcome Date GA Lbr Len/2nd Weight Sex Delivery Anes PTL Lv  3 Current           2 SAB 04/2018          1 Term 08/07/07 [redacted]w[redacted]d  3062 g F Vag-Spont      Past Surgical History:  Procedure Laterality Date  . HERNIA REPAIR    . WISDOM TOOTH EXTRACTION     Family History  Problem Relation Age of Onset  . Sickle cell trait Sister   . Asthma Brother   . Allergies Brother   . Other Neg Hx    Social History   Tobacco Use  . Smoking status: Never Smoker  . Smokeless tobacco: Never Used  Substance Use Topics  . Alcohol use: No  . Drug use: No   No Known Allergies No medications prior to admission.    I have reviewed patient's Past Medical Hx, Surgical Hx, Family Hx, Social Hx, medications and allergies.   ROS:  Review of Systems  Constitutional: Negative.   Gastrointestinal: Positive for abdominal pain.  Genitourinary: Positive for pelvic pain. Negative for dysuria, vaginal bleeding and vaginal discharge.   Musculoskeletal: Positive for back pain.    Physical Exam   Patient Vitals for the past 24 hrs:  BP Temp Temp src Pulse Resp SpO2 Height Weight  04/29/19 1126 126/72 - - 89 16 - - -  04/29/19 1112 129/67 - - 92 - - - -  04/29/19 1053 128/74 99.6 F (37.6 C) Oral (!) 101 16 100 % - -  04/29/19 1035 - - - - - - 5\' 4"  (1.626 m) 103.6 kg    Constitutional: Well-developed, well-nourished female in no acute distress.  Cardiovascular: normal rate & rhythm, no murmur Respiratory: normal effort, lung sounds clear throughout GI: Abd soft, non-tender, gravid appropriate for gestational age. Pos BS x 4 MS: Extremities nontender, no edema, normal ROM Neurologic: Alert and oriented x 4.  GU:      Pelvic: NEFG, physiologic discharge, no blood, cervix clean.   Dilation: Closed Effacement (%): Thick Station: Ballotable Presentation: Undeterminable Exam by:: Jorje Guild NP   NST:  Baseline: 140 bpm, Variability: Good {> 6 bpm), Accelerations: Reactive and Decelerations: Absent   Labs: Results for orders placed or performed during the hospital encounter of 04/29/19 (from the past 24 hour(s))  Urinalysis, Routine w reflex microscopic     Status: Abnormal  Collection Time: 04/29/19 10:38 AM  Result Value Ref Range   Color, Urine YELLOW YELLOW   APPearance CLEAR CLEAR   Specific Gravity, Urine 1.011 1.005 - 1.030   pH 7.0 5.0 - 8.0   Glucose, UA NEGATIVE NEGATIVE mg/dL   Hgb urine dipstick NEGATIVE NEGATIVE   Bilirubin Urine NEGATIVE NEGATIVE   Ketones, ur NEGATIVE NEGATIVE mg/dL   Protein, ur NEGATIVE NEGATIVE mg/dL   Nitrite NEGATIVE NEGATIVE   Leukocytes,Ua NEGATIVE NEGATIVE   RBC / HPF 0-5 0 - 5 RBC/hpf   WBC, UA 0-5 0 - 5 WBC/hpf   Bacteria, UA RARE (A) NONE SEEN   Squamous Epithelial / LPF 6-10 0 - 5   Mucus PRESENT    Hyaline Casts, UA PRESENT     Imaging:  No results found.  MAU Course: Orders Placed This Encounter  Procedures  . Culture, OB Urine  . Urinalysis,  Routine w reflex microscopic  . Discharge patient   Meds ordered this encounter  Medications  . Elastic Bandages & Supports (COMFORT FIT MATERNITY SUPP LG) MISC    Sig: 1 Units by Does not apply route daily.    Dispense:  1 each    Refill:  0    Order Specific Question:   Supervising Provider    Answer:   Duane LopeEURE, LUTHER H [[id number]]    MDM: Reactive NST. No contractions. Cervix closed/thick Pain description consistent with normal MSK pain of pregnancy. Will give rx for new maternity support belt she can get at [facility] where she can be measured for appropriate size.  U/a negative for signs of infection but will send for urine culture.   Assessment: 1. Back pain affecting pregnancy in third trimester   2. Pain of round ligament affecting pregnancy, antepartum   3. [redacted] weeks gestation of pregnancy     Plan: Discharge home in stable condition.  Preterm Labor precautions and fetal kick counts Rx maternity support belt  Follow-up Information    [facility] Follow up.   Specialty: Obstetrics and Gynecology Why: return for worsening symptoms Contact information: [address] [id number] [place] [id number] [phone]          Allergies as of 04/29/2019   No Known Allergies     Medication List    TAKE these medications   acetaminophen 500 MG tablet Commonly known as: TYLENOL Take 500 mg by mouth every 6 (six) hours as needed for moderate pain.   Comfort Fit Maternity Supp Lg Misc 1 Units by Does not apply route daily.   prenatal multivitamin Tabs tablet Take 1 tablet by mouth daily at 12 noon.       Judeth HornLawrence, Almon Whitford, NP 04/29/2019 4:10 PM

## 2019-04-29 NOTE — Discharge Instructions (Signed)
PREGNANCY SUPPORT BELT: You are not alone, Seventy-five percent of women have some sort of abdominal or back pain at some point in their pregnancy. Your baby is growing at a fast pace, which means that your whole body is rapidly trying to adjust to the changes. As your uterus grows, your back may start feeling a bit under stress and this can result in back or abdominal pain that can go from mild, and therefore bearable, to severe pains that will not allow you to sit or lay down comfortably, When it comes to dealing with pregnancy-related pains and cramps, some pregnant women usually prefer natural remedies, which the market is filled with nowadays. For example, wearing a pregnancy support belt can help ease and lessen your discomfort and pain. WHAT ARE THE BENEFITS OF WEARING A PREGNANCY SUPPORT BELT? A pregnancy support belt provides support to the lower portion of the belly taking some of the weight of the growing uterus and distributing to the other parts of your body. It is designed make you comfortable and gives you extra support. Over the years, the pregnancy apparel market has been studying the needs and wants of pregnant women and they have come up with the most comfortable pregnancy support belts that woman could ever ask for. In fact, you will no longer have to wear a stretched-out or bulky pregnancy belt that is visible underneath your clothes and makes you feel even more uncomfortable. Nowadays, a pregnancy support belt is made of comfortable and stretchy materials that will not irritate your skin but will actually make you feel at ease and you will not even notice you are wearing it. They are easy to put on and adjust during the day and can be worn at night for additional support.  BENEFITS:  Relives Back pain  Relieves Abdominal Muscle and Leg Pain  Stabilizes the Pelvic Ring  Offers a Cushioned Abdominal Lift Pad  Relieves pressure on the Sciatic Nerve Within Minutes WHERE TO GET  YOUR PREGNANCY BELT: [employer] [phone] @[phone]  [address] [place], [place] [id number]          Round Ligament Pain  The round ligament is a cord of muscle and tissue that helps support the uterus. It can become a source of pain during pregnancy if it becomes stretched or twisted as the baby grows. The pain usually begins in the second trimester (13-28 weeks) of pregnancy, and it can come and go until the baby is delivered. It is not a serious problem, and it does not cause harm to the baby. Round ligament pain is usually a short, sharp, and pinching pain, but it can also be a dull, lingering, and aching pain. The pain is felt in the lower side of the abdomen or in the groin. It usually starts deep in the groin and moves up to the outside of the hip area. The pain may occur when you:  Suddenly change position, such as quickly going from a sitting to standing position.  Roll over in bed.  Cough or sneeze.  Do physical activity. Follow these instructions at home:   Watch your condition for any changes.  When the pain starts, relax. Then try any of these methods to help with the pain: ? Sitting down. ? Flexing your knees up to your abdomen. ? Lying on your side with one pillow under your abdomen and another pillow between your legs. ? Sitting in a warm bath for 15-20 minutes or until the pain  goes away.  Take over-the-counter and prescription medicines only as told by your health care provider.  Move slowly when you sit down or stand up.  Avoid long walks if they cause pain.  Stop or reduce your physical activities if they cause pain.  Keep all follow-up visits as told by your health care provider. This is important. Contact a health care provider if:  Your pain does not go away with treatment.  You feel pain in your back that you did not have before.  Your medicine is not helping. Get help right away if:  You have a fever or chills.  You develop  uterine contractions.  You have vaginal bleeding.  You have nausea or vomiting.  You have diarrhea.  You have pain when you urinate. Summary  Round ligament pain is felt in the lower abdomen or groin. It is usually a short, sharp, and pinching pain. It can also be a dull, lingering, and aching pain.  This pain usually begins in the second trimester (13-28 weeks). It occurs because the uterus is stretching with the growing baby, and it is not harmful to the baby.  You may notice the pain when you suddenly change position, when you cough or sneeze, or during physical activity.  Relaxing, flexing your knees to your abdomen, lying on one side, or taking a warm bath may help to get rid of the pain.  Get help from your health care provider if the pain does not go away or if you have vaginal bleeding, nausea, vomiting, diarrhea, or painful urination. This information is not intended to replace advice given to you by your health care provider. Make sure you discuss any questions you have with your health care provider. Document Released: 07/13/2008 Document Revised: 03/22/2018 Document Reviewed: 03/22/2018 Elsevier Patient Education  2020 [employer].

## 2019-04-30 LAB — CULTURE, OB URINE: Culture: NO GROWTH

## 2019-05-15 LAB — OB RESULTS CONSOLE GBS: GBS: NEGATIVE

## 2019-06-08 ENCOUNTER — Telehealth (HOSPITAL_COMMUNITY): Payer: Self-pay | Admitting: *Deleted

## 2019-06-08 ENCOUNTER — Encounter (HOSPITAL_COMMUNITY): Payer: Self-pay | Admitting: *Deleted

## 2019-06-08 NOTE — Telephone Encounter (Signed)
Preadmission screen  

## 2019-06-12 ENCOUNTER — Other Ambulatory Visit: Payer: Self-pay | Admitting: Obstetrics & Gynecology

## 2019-06-13 ENCOUNTER — Other Ambulatory Visit: Payer: Self-pay

## 2019-06-13 ENCOUNTER — Other Ambulatory Visit (HOSPITAL_COMMUNITY)
Admission: RE | Admit: 2019-06-13 | Discharge: 2019-06-13 | Disposition: A | Discharge status: Home / Self Care | Payer: Medicaid Other | Source: Ambulatory Visit | Attending: Obstetrics & Gynecology | Admitting: Obstetrics & Gynecology

## 2019-06-13 DIAGNOSIS — Z20828 Contact with and (suspected) exposure to other viral communicable diseases: Secondary | ICD-10-CM | POA: Insufficient documentation

## 2019-06-13 DIAGNOSIS — Z01812 Encounter for preprocedural laboratory examination: Secondary | ICD-10-CM | POA: Diagnosis not present

## 2019-06-13 LAB — SARS CORONAVIRUS 2 (TAT 6-24 HRS): SARS Coronavirus 2: NEGATIVE

## 2019-06-13 NOTE — MAU Note (Signed)
Covid swab collected. Pt tolerated well. PT asymptomatic 

## 2019-06-15 ENCOUNTER — Inpatient Hospital Stay (HOSPITAL_COMMUNITY): Payer: Medicaid Other

## 2019-06-15 ENCOUNTER — Inpatient Hospital Stay (HOSPITAL_COMMUNITY)
Admission: AD | Admit: 2019-06-15 | Discharge: 2019-06-16 | DRG: 807 | Disposition: A | Discharge status: Home / Self Care | Payer: Medicaid Other | Attending: Obstetrics & Gynecology | Admitting: Obstetrics & Gynecology

## 2019-06-15 ENCOUNTER — Encounter (HOSPITAL_COMMUNITY): Payer: Self-pay | Admitting: *Deleted

## 2019-06-15 ENCOUNTER — Other Ambulatory Visit: Payer: Self-pay

## 2019-06-15 ENCOUNTER — Inpatient Hospital Stay (HOSPITAL_COMMUNITY): Payer: Medicaid Other | Admitting: Anesthesiology

## 2019-06-15 DIAGNOSIS — Z3A41 41 weeks gestation of pregnancy: Secondary | ICD-10-CM | POA: Diagnosis not present

## 2019-06-15 DIAGNOSIS — Z3480 Encounter for supervision of other normal pregnancy, unspecified trimester: Secondary | ICD-10-CM

## 2019-06-15 DIAGNOSIS — O480 Post-term pregnancy: Principal | ICD-10-CM | POA: Diagnosis present

## 2019-06-15 LAB — TYPE AND SCREEN
ABO/RH(D): B POS
Antibody Screen: NEGATIVE

## 2019-06-15 LAB — POCT FERN TEST: POCT Fern Test: POSITIVE — AB

## 2019-06-15 LAB — CBC
HCT: 38 % (ref 36.0–46.0)
Hemoglobin: 12.8 g/dL (ref 12.0–15.0)
MCH: 30.2 pg (ref 26.0–34.0)
MCHC: 33.7 g/dL (ref 30.0–36.0)
MCV: 89.6 fL (ref 80.0–100.0)
Platelets: 262 10*3/uL (ref 150–400)
RBC: 4.24 MIL/uL (ref 3.87–5.11)
RDW: 12.5 % (ref 11.5–15.5)
WBC: 14.4 10*3/uL — ABNORMAL HIGH (ref 4.0–10.5)
nRBC: 0 % (ref 0.0–0.2)

## 2019-06-15 MED ORDER — LACTATED RINGERS IV SOLN: 500.0000 mL | INTRAVENOUS | Status: DC | PRN

## 2019-06-15 MED ORDER — ONDANSETRON HCL 4 MG/2ML IJ SOLN: 4.0000 mg | Freq: Four times a day (QID) | INTRAMUSCULAR | Status: DC | PRN

## 2019-06-15 MED ORDER — PHENYLEPHRINE 40 MCG/ML (10ML) SYRINGE FOR IV PUSH (FOR BLOOD PRESSURE SUPPORT): 80.0000 ug | PREFILLED_SYRINGE | INTRAVENOUS | Status: DC | PRN

## 2019-06-15 MED ORDER — OXYTOCIN 40 UNITS IN NORMAL SALINE INFUSION - SIMPLE MED
2.5000 [IU]/h | INTRAVENOUS | Status: DC
  Administered 2019-06-15: 2.5 [IU]/h via INTRAVENOUS
  Filled 2019-06-15: qty 1000

## 2019-06-15 MED ORDER — OXYCODONE-ACETAMINOPHEN 5-325 MG PO TABS: 1.0000 | ORAL_TABLET | ORAL | Status: DC | PRN

## 2019-06-15 MED ORDER — FENTANYL CITRATE (PF) 100 MCG/2ML IJ SOLN
100.0000 ug | INTRAMUSCULAR | Status: AC | PRN
  Administered 2019-06-15 (×2): 100 ug via INTRAVENOUS
  Filled 2019-06-15 (×2): qty 2

## 2019-06-15 MED ORDER — IBUPROFEN 600 MG PO TABS
600.0000 mg | ORAL_TABLET | Freq: Four times a day (QID) | ORAL | Status: DC
  Administered 2019-06-15 – 2019-06-16 (×5): 600 mg via ORAL
  Filled 2019-06-15 (×5): qty 1

## 2019-06-15 MED ORDER — MEASLES, MUMPS & RUBELLA VAC IJ SOLR
0.5000 mL | Freq: Once | INTRAMUSCULAR | Status: AC
  Administered 2019-06-16: 0.5 mL via SUBCUTANEOUS
  Filled 2019-06-15: qty 0.5

## 2019-06-15 MED ORDER — ACETAMINOPHEN 325 MG PO TABS: 650.0000 mg | ORAL_TABLET | ORAL | Status: DC | PRN

## 2019-06-15 MED ORDER — SIMETHICONE 80 MG PO CHEW: 80.0000 mg | CHEWABLE_TABLET | ORAL | Status: DC | PRN

## 2019-06-15 MED ORDER — ONDANSETRON HCL 4 MG/2ML IJ SOLN: 4.0000 mg | INTRAMUSCULAR | Status: DC | PRN

## 2019-06-15 MED ORDER — OXYTOCIN BOLUS FROM INFUSION
500.0000 mL | Freq: Once | INTRAVENOUS | Status: AC
  Administered 2019-06-15: 500 mL via INTRAVENOUS

## 2019-06-15 MED ORDER — OXYCODONE-ACETAMINOPHEN 5-325 MG PO TABS: 2.0000 | ORAL_TABLET | ORAL | Status: DC | PRN

## 2019-06-15 MED ORDER — FENTANYL-BUPIVACAINE-NACL 0.5-0.125-0.9 MG/250ML-% EP SOLN
12.0000 mL/h | EPIDURAL | Status: DC | PRN
  Filled 2019-06-15: qty 250

## 2019-06-15 MED ORDER — SENNOSIDES-DOCUSATE SODIUM 8.6-50 MG PO TABS
2.0000 | ORAL_TABLET | ORAL | Status: DC
  Administered 2019-06-15: 2 via ORAL
  Filled 2019-06-15: qty 2

## 2019-06-15 MED ORDER — LACTATED RINGERS IV SOLN
500.0000 mL | Freq: Once | INTRAVENOUS | Status: AC
  Administered 2019-06-15: 500 mL via INTRAVENOUS

## 2019-06-15 MED ORDER — PRENATAL MULTIVITAMIN CH
1.0000 | ORAL_TABLET | Freq: Every day | ORAL | Status: DC
  Administered 2019-06-15 – 2019-06-16 (×2): 1 via ORAL
  Filled 2019-06-15 (×2): qty 1

## 2019-06-15 MED ORDER — DIPHENHYDRAMINE HCL 50 MG/ML IJ SOLN: 12.5000 mg | INTRAMUSCULAR | Status: DC | PRN

## 2019-06-15 MED ORDER — ZOLPIDEM TARTRATE 5 MG PO TABS: 5.0000 mg | ORAL_TABLET | Freq: Every evening | ORAL | Status: DC | PRN

## 2019-06-15 MED ORDER — BENZOCAINE-MENTHOL 20-0.5 % EX AERO
1.0000 "application " | INHALATION_SPRAY | CUTANEOUS | Status: DC | PRN
  Filled 2019-06-15: qty 56

## 2019-06-15 MED ORDER — DIPHENHYDRAMINE HCL 25 MG PO CAPS: 25.0000 mg | ORAL_CAPSULE | Freq: Four times a day (QID) | ORAL | Status: DC | PRN

## 2019-06-15 MED ORDER — ONDANSETRON HCL 4 MG PO TABS: 4.0000 mg | ORAL_TABLET | ORAL | Status: DC | PRN

## 2019-06-15 MED ORDER — LACTATED RINGERS IV SOLN
INTRAVENOUS | Status: DC
  Administered 2019-06-15: 03:00:00 via INTRAVENOUS

## 2019-06-15 MED ORDER — COCONUT OIL OIL: 1.0000 "application " | TOPICAL_OIL | Status: DC | PRN

## 2019-06-15 MED ORDER — WITCH HAZEL-GLYCERIN EX PADS: 1.0000 "application " | MEDICATED_PAD | CUTANEOUS | Status: DC | PRN

## 2019-06-15 MED ORDER — EPHEDRINE 5 MG/ML INJ: 10.0000 mg | INTRAVENOUS | Status: DC | PRN

## 2019-06-15 MED ORDER — SOD CITRATE-CITRIC ACID 500-334 MG/5ML PO SOLN: 30.0000 mL | ORAL | Status: DC | PRN

## 2019-06-15 MED ORDER — TETANUS-DIPHTH-ACELL PERTUSSIS 5-2.5-18.5 LF-MCG/0.5 IM SUSP: 0.5000 mL | Freq: Once | INTRAMUSCULAR | Status: DC

## 2019-06-15 MED ORDER — LIDOCAINE HCL (PF) 1 % IJ SOLN: 30.0000 mL | INTRAMUSCULAR | Status: DC | PRN

## 2019-06-15 MED ORDER — DIBUCAINE (PERIANAL) 1 % EX OINT: 1.0000 "application " | TOPICAL_OINTMENT | CUTANEOUS | Status: DC | PRN

## 2019-06-15 NOTE — Anesthesia Preprocedure Evaluation (Deleted)
Anesthesia Evaluation  Patient identified by MRN, date of birth, ID band Patient awake    Reviewed: Allergy & Precautions, NPO status , Patient's Chart, lab work & pertinent test results  Airway Mallampati: II  TM Distance: >3 FB Neck ROM: Full    Dental no notable dental hx.    Pulmonary neg pulmonary ROS,    Pulmonary exam normal breath sounds clear to auscultation       Cardiovascular Exercise Tolerance: Good Normal cardiovascular exam Rhythm:Regular Rate:Normal     Neuro/Psych  Headaches, negative psych ROS   GI/Hepatic Neg liver ROS,   Endo/Other    Renal/GU      Musculoskeletal negative musculoskeletal ROS (+)   Abdominal (+) + obese,   Peds  Hematology negative hematology ROS (+)   Anesthesia Other Findings   Reproductive/Obstetrics (+) Pregnancy                             Lab Results  Component Value Date   WBC 14.4 (H) 06/15/2019   HGB 12.8 06/15/2019   HCT 38.0 06/15/2019   MCV 89.6 06/15/2019   PLT 262 06/15/2019    Anesthesia Physical Anesthesia Plan  ASA: III  Anesthesia Plan: Epidural   Post-op Pain Management:    Induction:   PONV Risk Score and Plan:   Airway Management Planned:   Additional Equipment:   Intra-op Plan:   Post-operative Plan:   Informed Consent: I have reviewed the patients History and Physical, chart, labs and discussed the procedure including the risks, benefits and alternatives for the proposed anesthesia with the patient or authorized representative who has indicated his/her understanding and acceptance.       Plan Discussed with:   Anesthesia Plan Comments:         Anesthesia Quick Evaluation

## 2019-06-15 NOTE — MAU Note (Signed)
SROM around 0030-yellow/green fluid.  Contractions 7-10 mins apart.  No VB.  + FM.

## 2019-06-15 NOTE — Anesthesia Procedure Notes (Deleted)
Epidural Patient location during procedure: OB Start time: 06/15/2019 5:26 AM  Staffing Anesthesiologist: Barnet Glasgow, MD Performed: anesthesiologist   Preanesthetic Checklist Completed: patient identified, site marked, surgical consent, pre-op evaluation, timeout performed, IV checked, risks and benefits discussed and monitors and equipment checked  Epidural Patient position: sitting Prep: site prepped and draped and DuraPrep Patient monitoring: continuous pulse ox and blood pressure Approach: midline Injection technique: LOR air  Needle:  Needle type: Tuohy  Needle gauge: 17 G Needle length: 9 cm and 9 Needle insertion depth: 5 cm cm Catheter type: closed end flexible Catheter size: 19 Gauge Catheter at skin depth: 10 cm Test dose: negative  Assessment Events: blood not aspirated, injection not painful, no injection resistance, negative IV test and no paresthesia  Additional Notes Patient identified. Risks/Benefits/Options discussed with patient including but not limited to bleeding, infection, nerve damage, paralysis, failed block, incomplete pain control, headache, blood pressure changes, nausea, vomiting, reactions to medication both or allergic, itching and postpartum back pain. Confirmed with bedside nurse the patient's most recent platelet count. Confirmed with patient that they are not currently taking any anticoagulation, have any bleeding history or any family history of bleeding disorders. Patient expressed understanding and wished to proceed. All questions were answered. Sterile technique was used throughout the entire procedure. Please see nursing notes for vital signs. Test dose was given through epidural needle and negative prior to continuing to dose epidural or start infusion. Warning signs of high block given to the patient including shortness of breath, tingling/numbness in hands, complete motor block, or any concerning symptoms with instructions to call for  help. Patient was given instructions on fall risk and not to get out of bed. All questions and concerns addressed with instructions to call with any issues.  Attempt (S) . Patient tolerated procedure well.

## 2019-06-15 NOTE — H&P (Signed)
April Malone is a 28 y.o. female, G3P1011, IUP at 41 weeks, presenting for spontaneous labor with SROM @0030 , meconium stained. Cxt started being regular every 6-7 mins x1 day ago. increased risk for spinal muscular atrophy, low risk female panorama, desired in pt circ. EFW 6.11 on 7/21. Rubella non-immune. GBS-. Pt endorse + Fm.  Denies vaginal bleeding.   Patient Active Problem List   Diagnosis Date Noted  . Supervision of other normal pregnancy, antepartum 11/17/2018    Medications Prior to Admission  Medication Sig Dispense Refill Last Dose  . acetaminophen (TYLENOL) 500 MG tablet Take 500 mg by mouth every 6 (six) hours as needed for moderate pain.     . Elastic Bandages & Supports (COMFORT FIT MATERNITY SUPP LG) MISC 1 Units by Does not apply route daily. 1 each 0   . Prenatal Vit-Fe Fumarate-FA (PRENATAL MULTIVITAMIN) TABS tablet Take 1 tablet by mouth daily at 12 noon.       Past Medical History:  Diagnosis Date  . Headache(784.0)      No current facility-administered medications on file prior to encounter.    Current Outpatient Medications on File Prior to Encounter  Medication Sig Dispense Refill  . acetaminophen (TYLENOL) 500 MG tablet Take 500 mg by mouth every 6 (six) hours as needed for moderate pain.    . Elastic Bandages & Supports (COMFORT FIT MATERNITY SUPP LG) MISC 1 Units by Does not apply route daily. 1 each 0  . Prenatal Vit-Fe Fumarate-FA (PRENATAL MULTIVITAMIN) TABS tablet Take 1 tablet by mouth daily at 12 noon.       No Known Allergies  History of present pregnancy: Pt Info/Preference:  Screening/Consents:  Labs:   EDD: Estimated Date of Delivery: 06/08/19  Establised: Patient's last menstrual period was 09/01/2018.  Anatomy Scan: Date: 02/08/2019 Placenta Location: posterior Genetic Screen: Panoroma:low risk female AFP:  First Tri: Quad:  Office: [facility]             First PNV: 14.5 wg Blood Type B/Positive/-- (02/27 0000)  Language: [place] Last PNV: 40.5 wg  Rhogam    Flu Vaccine:  declined   Antibody Negative (02/27 0000)  TDaP vaccine itd   GTT: Early: 5.2 Third Trimester: 117  Feeding Plan: Breast/bottle BTL: no Rubella: Nonimmune (02/27 0000)  Contraception: Nexplanon VBAC: no RPR: Nonreactive (02/27 0000)   Circumcision: In pt desired   HBsAg: Negative (02/27 0000)  Pediatrician:  [facility]   HIV: Non-reactive (02/27 0000)   Prenatal Classes: no Additional US: Growth 7/21 see below GBS: Negative (07/28 0000)(For PCN allergy, check sensitivities)       Chlamydia: neg    MFM Referral/Consult:  GC: neg  Support Person: partner   PAP: 2020-wnl  Pain Management: epidural Neonatologist Referral:  Hgb Electrophoresis:  AA  Birth Plan: none   Hgb NOB: 11.8    28W: 11.8  Growth 7/21  OB History    Gravida  3   Para  1   Term  1   Preterm      AB  1   Living  1     SAB  1   TAB      Ectopic      Multiple      Live Births             Past Medical History:  Diagnosis Date  . [id number](478.2Headache(784.0)    Past Surgical History:  Procedure Laterality Date  . HERNIA REPAIR    . WISDOM TOOTH  EXTRACTION     Family History: family history includes Allergies in her brother; Asthma in her brother; Sickle cell trait in her sister. Social History:  reports that she has never smoked. She has never used smokeless tobacco. She reports that she does not drink alcohol or use drugs.   Prenatal Transfer Tool  Maternal Diabetes: No Genetic Screening: Abnormal:  Results: Other:increased risk for spinal muscular atrophy, low risk female panoroma Maternal Ultrasounds/Referrals: Normal Fetal Ultrasounds or other Referrals:  None Maternal Substance Abuse:  No Significant Maternal Medications:  None Significant Maternal Lab Results: Group B Strep negative  ROS:  Review of Systems  Constitutional: Negative.   HENT: Negative.   Eyes: Negative.   Respiratory: Negative.   Cardiovascular: Negative.   Gastrointestinal: Positive for abdominal  pain.  Genitourinary:       Leakage of fluids  Musculoskeletal: Negative.   Skin: Negative.   Neurological: Negative.   Endo/Heme/Allergies: Negative.   Psychiatric/Behavioral: Negative.      Physical Exam: BP 137/77   Pulse 93   Temp 98.2 F (36.8 C)   Resp 19   Wt 102.6 kg   LMP 09/01/2018   SpO2 99%   BMI 38.84 kg/m   Physical Exam  Constitutional: She is oriented to person, place, and time and well-developed, well-nourished, and in no distress.  HENT:  Head: Normocephalic and atraumatic.  Eyes: Pupils are equal, round, and reactive to light. Conjunctivae are normal.  Neck: Normal range of motion. Neck supple.  Cardiovascular: Normal rate and regular rhythm.  Pulmonary/Chest: Effort normal and breath sounds normal.  Abdominal: Soft. Bowel sounds are normal.  Genitourinary:    Genitourinary Comments: Uterus soft non-tender, gravida equal to dates. Pelvis adequate for vaginal delivery    Musculoskeletal: Normal range of motion.  Neurological: She is alert and oriented to person, place, and time.  Skin: Skin is warm and dry.  Psychiatric: Affect normal.  Nursing note and vitals reviewed.    NST: FHR baseline 140 bpm, Variability: moderate, Accelerations:present, Decelerations:  Absent= Cat 1/Reactive UC:   regular, every 6-7 minutes SVE:   Dilation: 3 Effacement (%): 100 Station: -1 Exam by:: [facility] CNM, vertex verified by fetal sutures.  Leopold's: Position vertex, EFW 7.5lbs via leopold's.  Pelvis proven to 6.11lbs 11 years ago   Labs: Results for orders placed or performed during the hospital encounter of 06/15/19 (from the past 24 hour(s))  Fern Test     Status: Abnormal   Collection Time: 06/15/19  1:57 AM  Result Value Ref Range   POCT Fern Test Positive = ruptured amniotic membanes (A)     Imaging:  No results found.  MAU Course: Orders Placed This Encounter  Procedures  . Cervical Exam  . Maryann Alar Test   No orders of the defined types were  placed in this encounter.   Assessment/Plan: April Malone is a 28 y.o. female, G3P1011, IUP at 41 weeks, presenting for spontaneous labor with SROM @0030 , meconium stained. Cxt started being regular every 6-7 mins x1 day ago. increased risk for spinal muscular atrophy, low risk female panorama, desired in pt circ. EFW 6.11 on 7/21. Rubella non-immune. GBS-. Pt endorse + Fm.  Denies vaginal bleeding.   FWB: Cat 1 Fetal Tracing.   Plan: Admit to [place] per consult with Mancel Bale Routine [facility] orders Pain med/epidural prn Anticipate labor progression  Rubella Non-Immune: vaccine PP Baby Female: Cir in pt  G. L. Garci­a NP-C, CNM, MSN 06/15/2019, 2:21 AM

## 2019-06-16 LAB — CBC
HCT: 36.2 % (ref 36.0–46.0)
Hemoglobin: 12 g/dL (ref 12.0–15.0)
MCH: 30.2 pg (ref 26.0–34.0)
MCHC: 33.1 g/dL (ref 30.0–36.0)
MCV: 91.2 fL (ref 80.0–100.0)
Platelets: 249 10*3/uL (ref 150–400)
RBC: 3.97 MIL/uL (ref 3.87–5.11)
RDW: 12.6 % (ref 11.5–15.5)
WBC: 14.3 10*3/uL — ABNORMAL HIGH (ref 4.0–10.5)
nRBC: 0 % (ref 0.0–0.2)

## 2019-06-16 LAB — ABO/RH: ABO/RH(D): B POS

## 2019-06-16 LAB — RPR: RPR Ser Ql: NONREACTIVE

## 2019-06-16 MED ORDER — ACETAMINOPHEN FOR CIRCUMCISION 160 MG/5 ML
40.0000 mg | Freq: Once | ORAL | Status: DC
  Filled 2019-06-16: qty 1.25

## 2019-06-16 MED ORDER — ACETAMINOPHEN FOR CIRCUMCISION 160 MG/5 ML
40.0000 mg | ORAL | Status: DC | PRN
  Filled 2019-06-16: qty 1.25

## 2019-06-16 MED ORDER — WHITE PETROLATUM EX OINT: 1.0000 "application " | TOPICAL_OINTMENT | CUTANEOUS | Status: DC | PRN

## 2019-06-16 MED ORDER — EPINEPHRINE TOPICAL FOR CIRCUMCISION 0.1 MG/ML
1.0000 [drp] | TOPICAL | Status: DC | PRN
  Filled 2019-06-16: qty 1

## 2019-06-16 MED ORDER — SUCROSE 24% NICU/PEDS ORAL SOLUTION: 0.5000 mL | OROMUCOSAL | Status: DC | PRN

## 2019-06-16 MED ORDER — IBUPROFEN 600 MG PO TABS: 600.0000 mg | ORAL_TABLET | Freq: Four times a day (QID) | ORAL | 0 refills | Status: AC

## 2019-06-16 MED ORDER — LIDOCAINE 1% INJECTION FOR CIRCUMCISION
0.8000 mL | INJECTION | Freq: Once | INTRAVENOUS | Status: DC
  Filled 2019-06-16 (×2): qty 1

## 2019-06-16 NOTE — Lactation Note (Signed)
This note was copied from a baby's chart. Lactation Consultation Note  Patient Name: April Malone [id number] Date: 06/16/2019 Reason for consult: Initial assessment;1st time breastfeeding;Term P2, 42 hour female infant. Infant had one stool and one void since delivery. Mom has DEBP and receives [facility] in [place].   Mom's feeding choice at admission was breast and formula feeding. Mom was given breast shells and hand pump by Nurse due flat nipples. Per mom, infant not been sustaining latch comes on and off breast maybe breastfeed 5 to 15 minutes. Infant is been using a pacifier. LC discussed risk of pacifier usage, non-nutritive suckling and possible nipple confusion. Mom plans to stop pacifier usage until infant is one month of age.  Mom did breast stimulation prior to latching infant to breast  Infant would not sustain latch after multiple attempts. Mom was fitted with 24 NS that was pre-filled with 1 ml of Gerber Gentle with iron, after a few attempts,  infant sustained latch, swallows observed  and breastfeed for 15 minutes and was still breastfeed when [facility] left the room.  Mom plans to supplement with formula after breastfeeding infant according to infant's age/ hours of life ( this is mom's choice). Mom knows to breastfeed according hunger cues, 8 to 12 times within 24 hours and on demand. Mom knows to call Nurse or [place] if she has any questions, concerns or need assistance with latching infant to breast. Reviewed Baby & Me book's Breastfeeding Basics. Mom made aware of O/P services, breastfeeding support groups, community resources, and our phone # for post-discharge questions.    Maternal Data Formula Feeding for Exclusion: Yes Reason for exclusion: Mother's choice to formula and breast feed on admission Has patient been taught Hand Expression?: Yes Does the patient have breastfeeding experience prior to this delivery?: No  Feeding Feeding Type: Breast Fed  LATCH Score Latch:  Repeated attempts needed to sustain latch, nipple held in mouth throughout feeding, stimulation needed to elicit sucking reflex.  Audible Swallowing: A few with stimulation  Type of Nipple: Flat  Comfort (Breast/Nipple): Soft / non-tender  Hold (Positioning): Assistance needed to correctly position infant at breast and maintain latch.  LATCH Score: 6  Interventions Interventions: Skin to skin;Support pillows;Assisted with latch;Adjust position;Hand pump;Breast compression;Breast feeding basics reviewed;Breast massage;Position options;Hand express;Pre-pump if needed;Expressed milk  Lactation Tools Discussed/Used Tools: Pump;Nipple Shields Nipple shield size: 24 Breast pump type: Manual WIC Program: Yes Pump Review: Setup, frequency, and cleaning;Milk Storage Initiated by:: Nurse Date initiated:: 06/15/19   Consult Status Consult Status: Follow-up Date: 06/16/19 Follow-up type: In-patient    Vicente Serene 06/16/2019, 1:01 AM

## 2019-06-16 NOTE — Discharge Summary (Signed)
Obstetric Discharge Summary Reason for Admission: onset of labor Prenatal Procedures: NST Intrapartum Procedures: spontaneous vaginal delivery Postpartum Procedures: none Complications-Operative and Postpartum: none Hemoglobin  Date Value Ref Range Status  06/16/2019 12.0 12.0 - 15.0 g/dL Final   HCT  Date Value Ref Range Status  06/16/2019 36.2 36.0 - 46.0 % Final    Physical Exam:  General: alert, cooperative and no distress Lochia: appropriate Uterine Fundus: firm DVT Evaluation: No evidence of DVT seen on physical exam. Negative Homan's sign. No cords or calf tenderness.  Discharge Diagnoses: Term Pregnancy-delivered  Discharge Information: Date: 06/16/2019 Activity: pelvic rest Diet: routine Medications: PNV and Ibuprofen Condition: stable Instructions: refer to practice specific booklet Discharge to: home   Newborn Data: Live born female  Birth Weight: 7 lb 11.8 oz (3510 g) APGAR: 6, 9  Newborn Delivery   Birth date/time: 06/15/2019 05:43:00 Delivery type: Vaginal, Spontaneous      Home with mother.  [place], [place] 06/16/2019, 11:46 AM

## 2019-06-20 IMAGING — US US OB < 14 WEEKS - US OB TV
1 series · 15 of 28 positions shown · non-contrast
Comparison: None.

CLINICAL DATA: Initial evaluation for pregnancy of unknown anatomic
location.

EXAM:
OBSTETRIC <14 WK US AND TRANSVAGINAL OB US
TECHNIQUE: Both transabdominal and transvaginal ultrasound examinations were
performed for complete evaluation of the gestation as well as the
maternal uterus, adnexal regions, and pelvic cul-de-sac.
Transvaginal technique was performed to assess early pregnancy.

[Series 1: us ob < 14 weeks - us ob tv · 53 acquisitions, 15 frames shown]
[im 1/53]
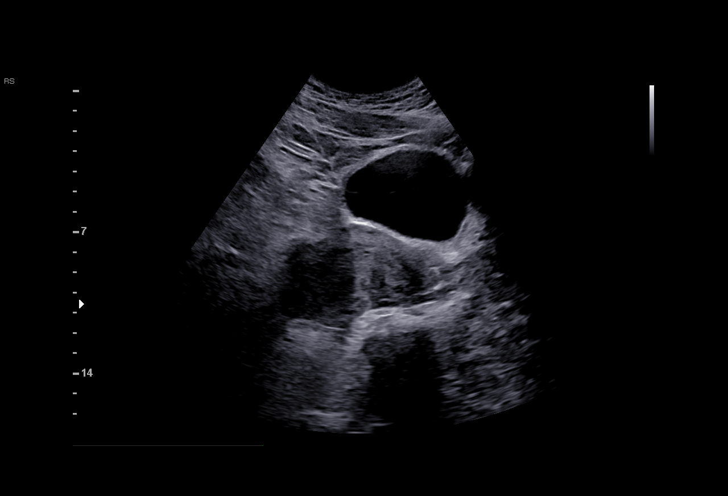
[im 4/53]
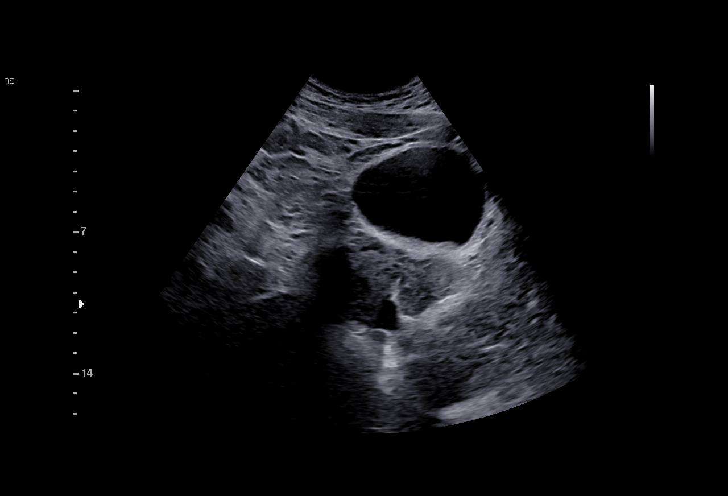
[im 8/53]
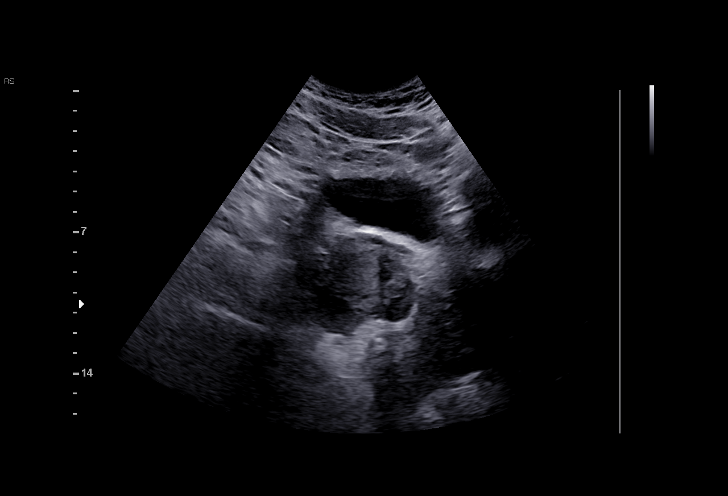
[im 12/53]
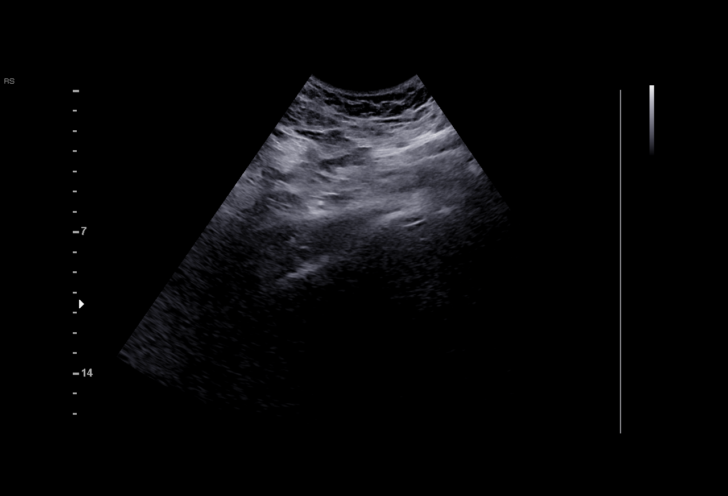
[im 16/53]
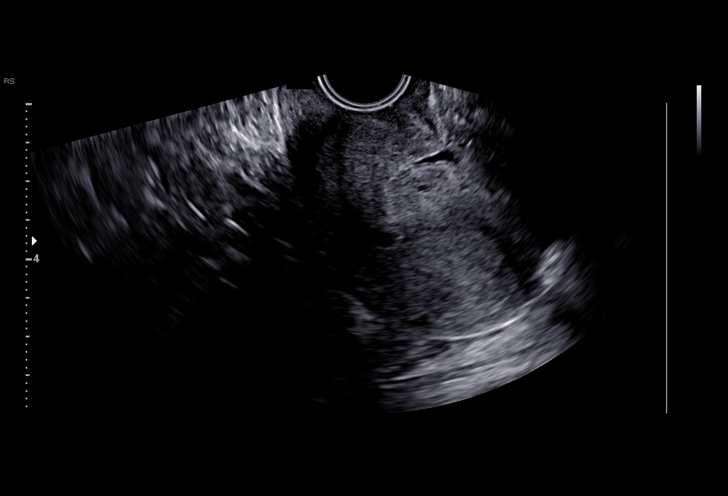
[im 20/53]
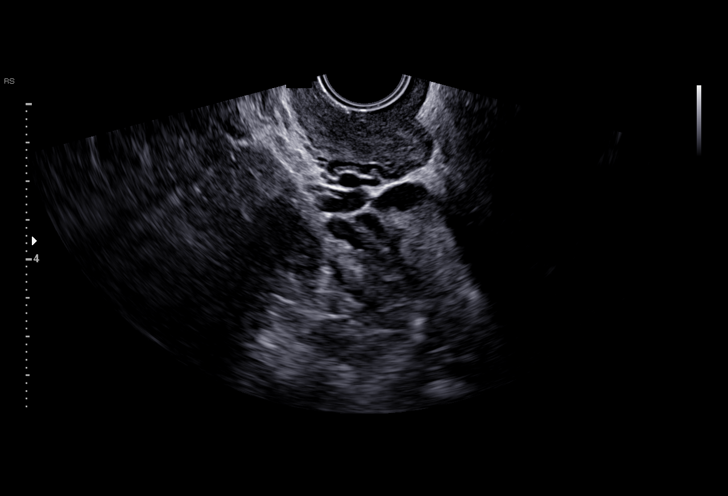
[im 24/53]
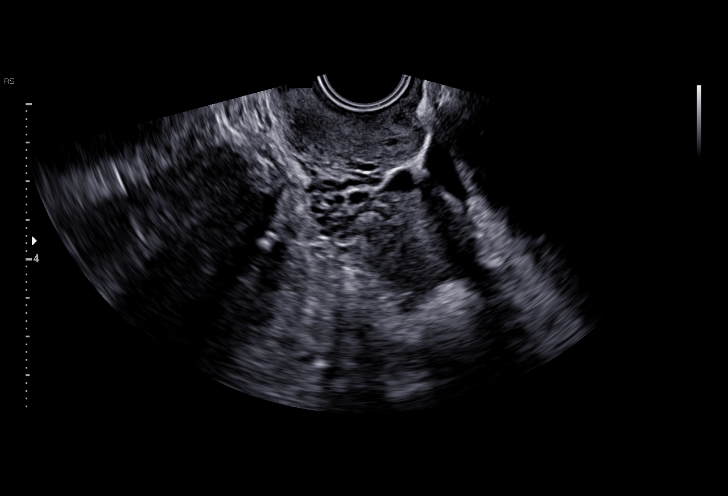
[im 27/53]
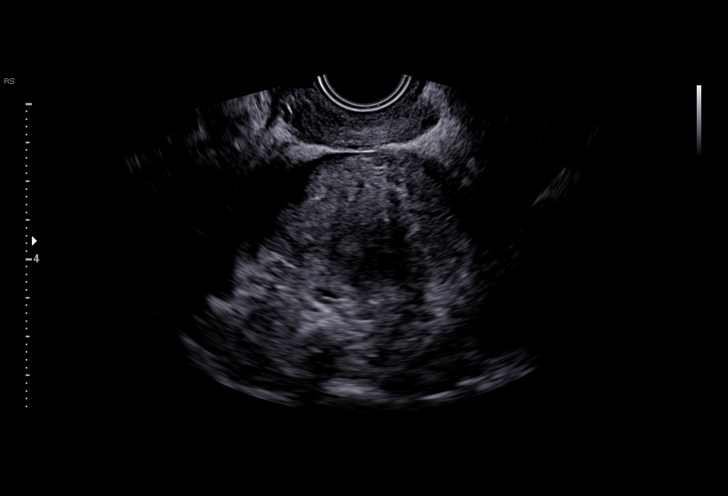
[im 29/53]
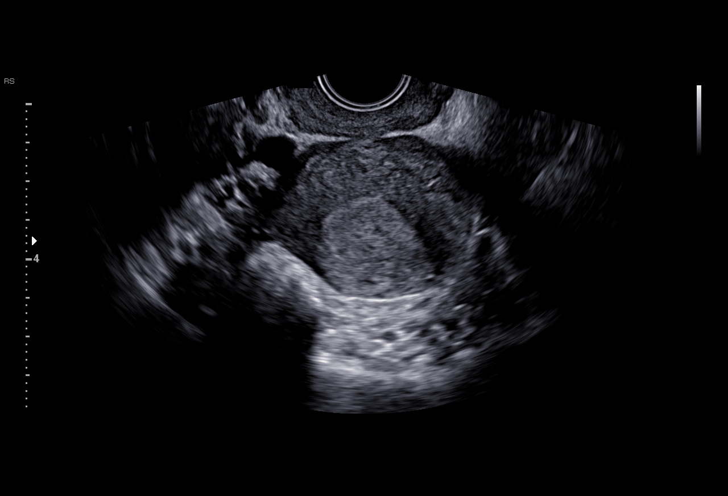
[im 33/53]
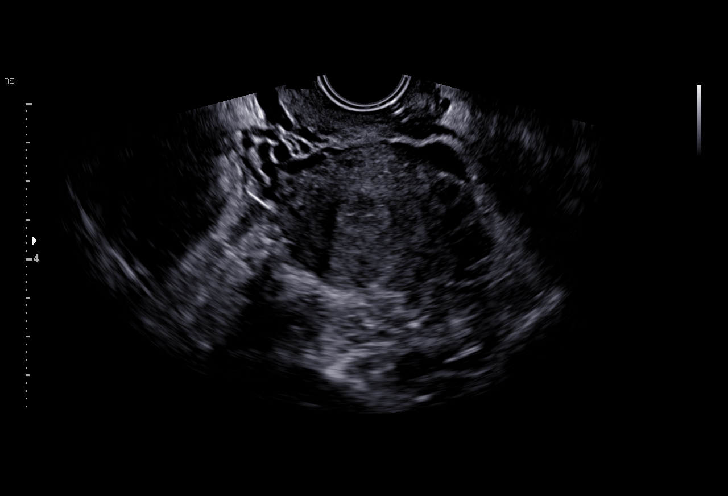
[im 37/53]
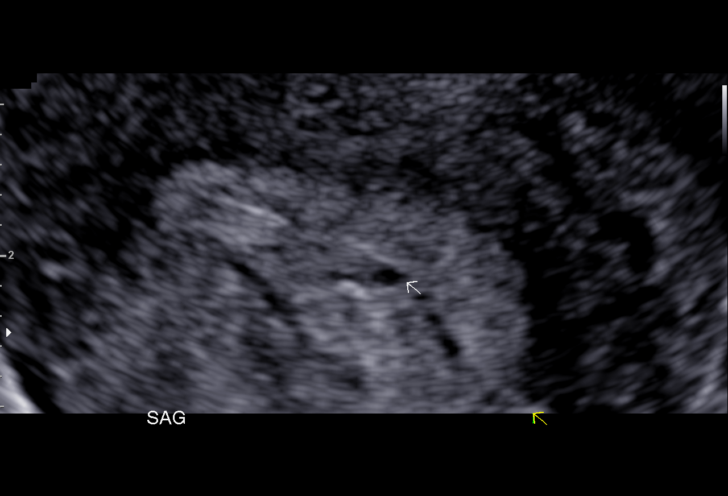
[im 41/53]
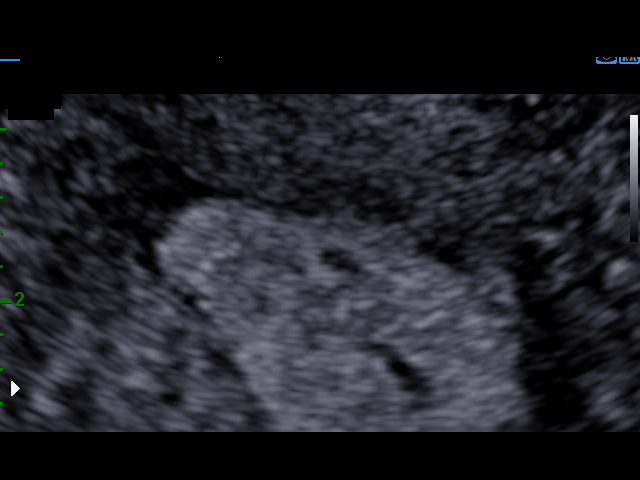
[im 45/53]
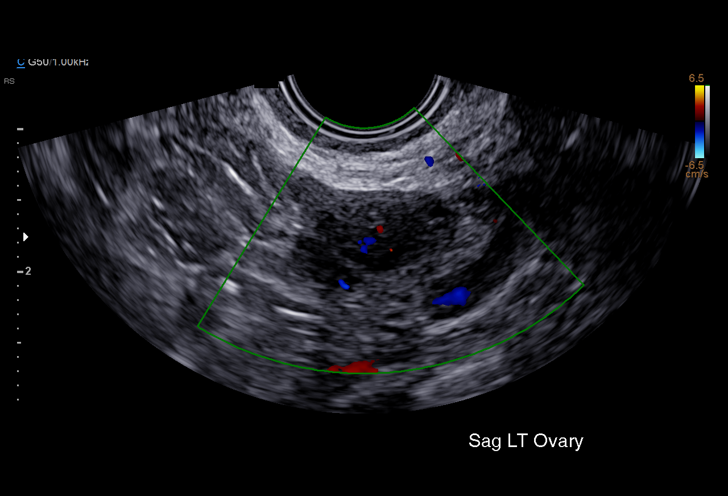
[im 49/53]
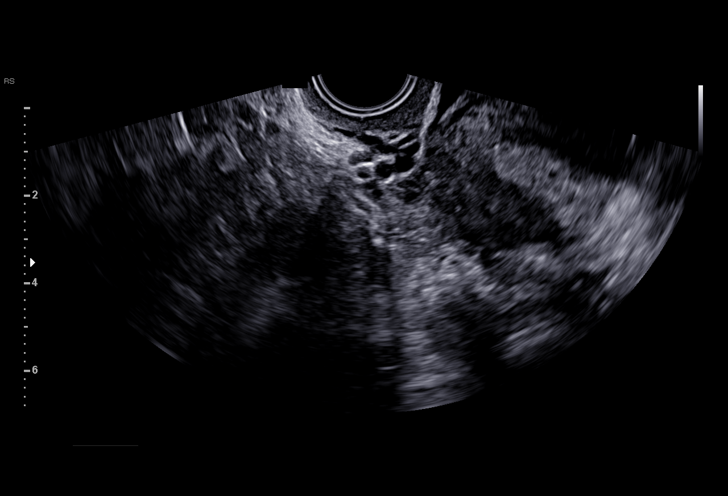
[im 53/53]
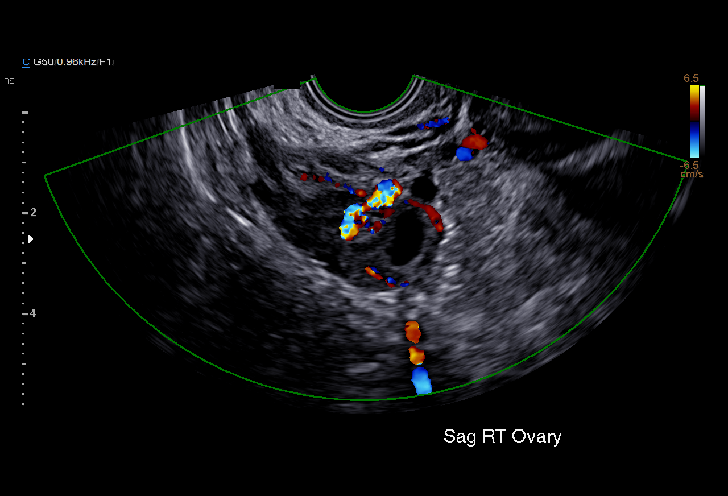

[15 of 28 positions shown; findings below may reference images not displayed]

FINDINGS: Intrauterine gestational sac: Possible tiny early gestational sac
measuring 2 mm noted within the endometrial cavity.

Yolk sac:  Negative.

Embryo:  Negative.

Cardiac Activity: N/A

Heart Rate: N/A  bpm

MSD: 0.16 mm.  Small size out of range for dating.

Subchorionic hemorrhage:  None visualized.

Maternal uterus/adnexae: Ovaries within normal limits bilaterally.
Corpus luteal cyst noted on the right. Small volume free fluid noted
within the left adnexa.
IMPRESSION: 1. Possible early intrauterine gestational sac, but no yolk sac,
fetal pole, or cardiac activity yet visualized. Recommend follow-up
quantitative B-HCG levels and follow-up US in 14 days to assess
viability. This recommendation follows SRU consensus guidelines:
Diagnostic Criteria for Nonviable Pregnancy Early in the First
Trimester. N Engl J Med 9639; [DATE].
2. No other acute maternal uterine or adnexal abnormality. No
adnexal mass.

## 2019-08-31 ENCOUNTER — Encounter (HOSPITAL_COMMUNITY): Payer: Self-pay

## 2019-11-28 ENCOUNTER — Other Ambulatory Visit: Payer: Self-pay | Admitting: Obstetrics and Gynecology

## 2019-11-28 DIAGNOSIS — R102 Pelvic and perineal pain: Secondary | ICD-10-CM

## 2019-12-17 ENCOUNTER — Other Ambulatory Visit: Payer: Medicaid Other

## 2019-12-17 IMAGING — US US OB < 14 WEEKS - US OB TV
1 series · 15 of 28 positions shown · non-contrast
Comparison: Ultrasound May 06, 2018.

CLINICAL DATA: First trimester of pregnancy, abdominal pain.

EXAM:
OBSTETRIC <14 WK US AND TRANSVAGINAL OB US
TECHNIQUE: Both transabdominal and transvaginal ultrasound examinations were
performed for complete evaluation of the gestation as well as the
maternal uterus, adnexal regions, and pelvic cul-de-sac.
Transvaginal technique was performed to assess early pregnancy.

[Series 1: us ob < 14 weeks - us ob tv · 15 of 31 slices shown]
[im 1/31]
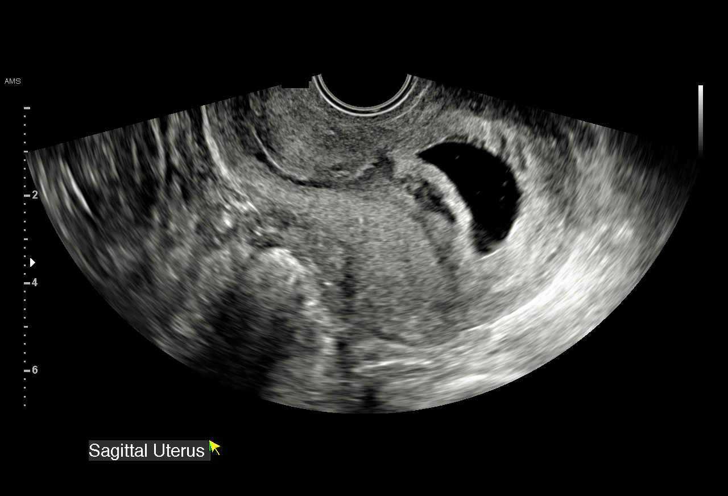
[im 3/31]
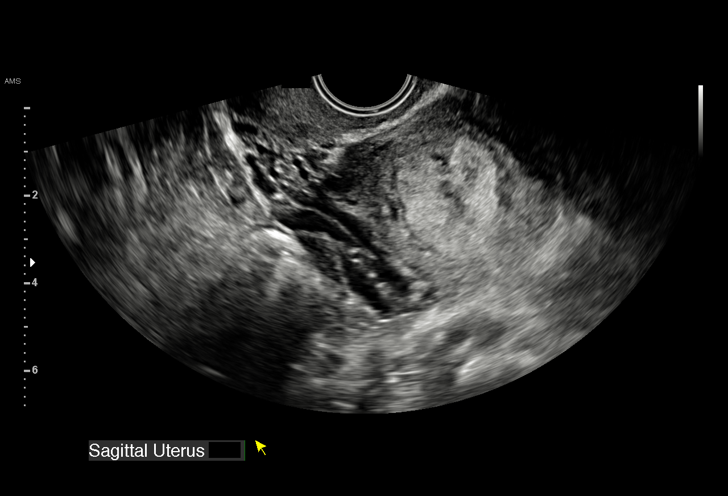
[im 5/31]
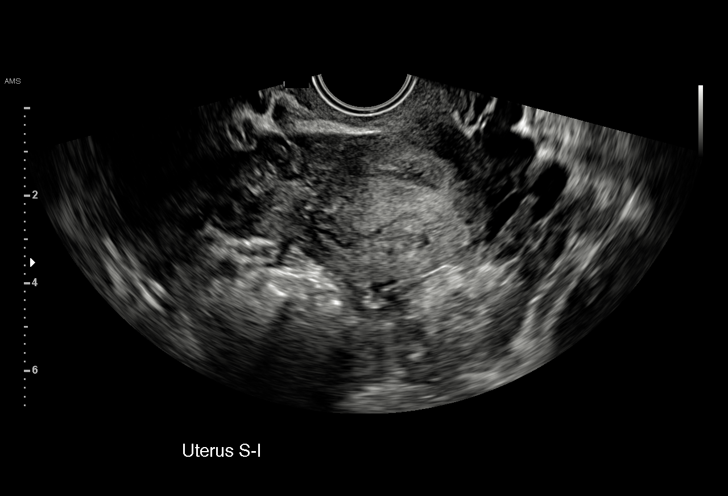
[im 7/31]
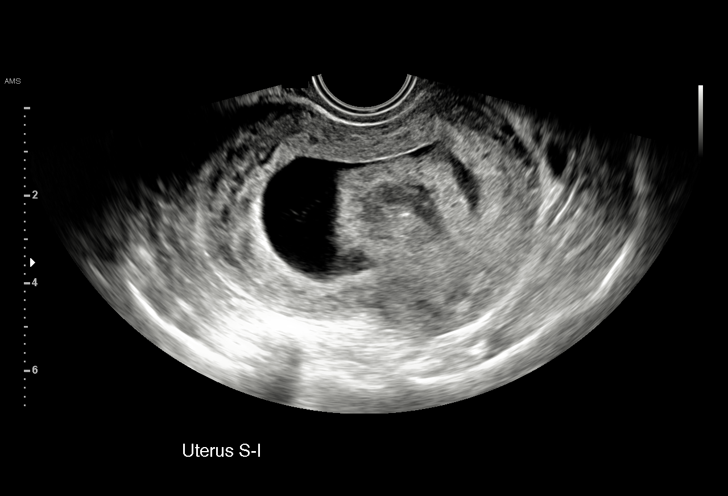
[im 9/31]
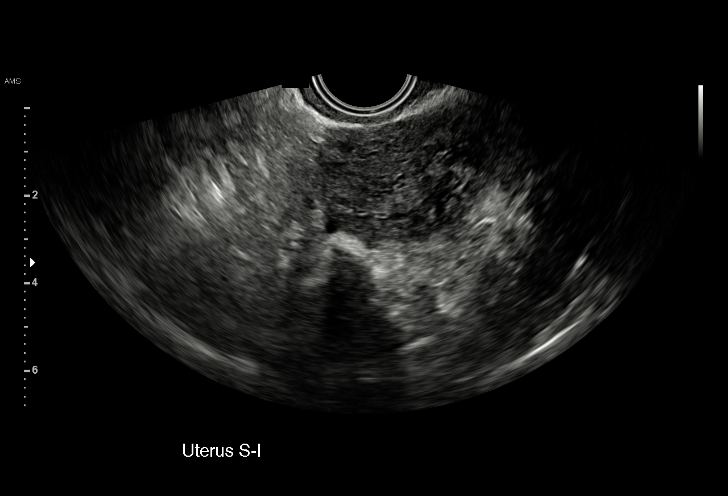
[im 12/31]
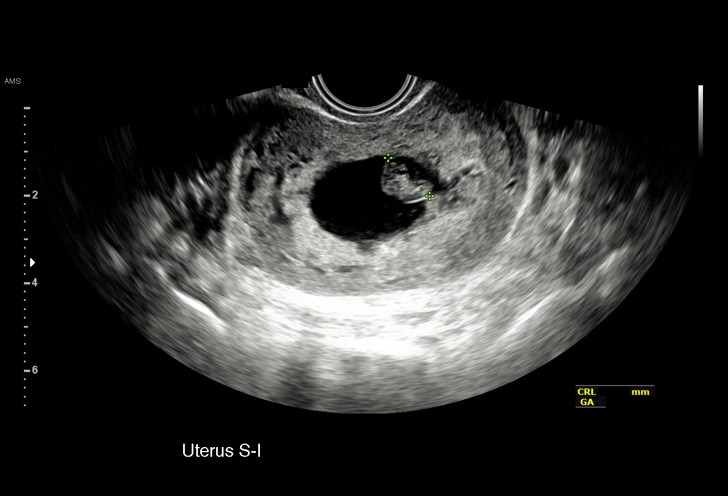
[im 14/31]
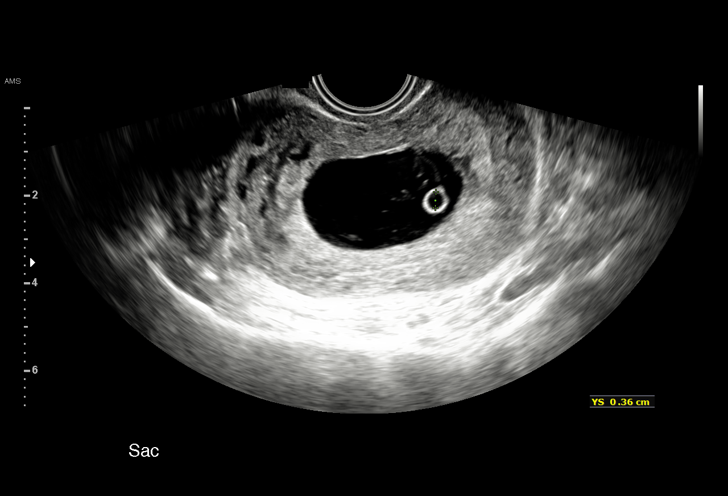
[im 16/31]
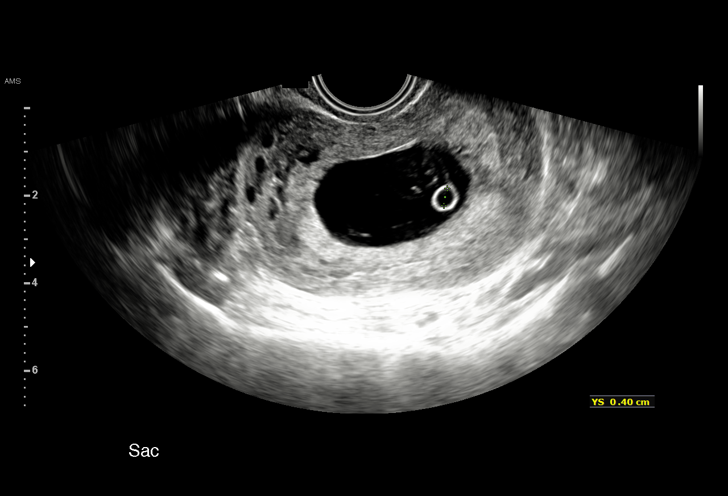
[im 17/31]
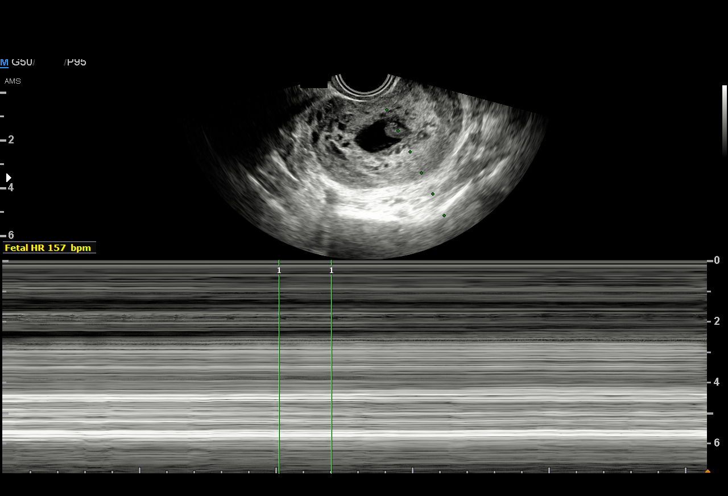
[im 19/31]
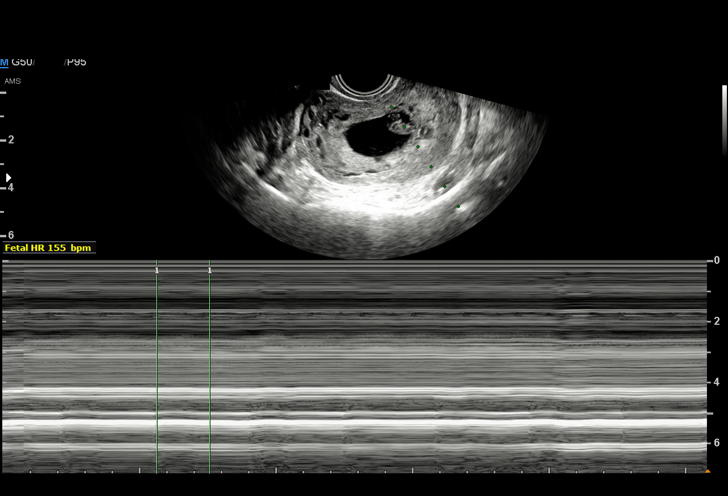
[im 22/31]
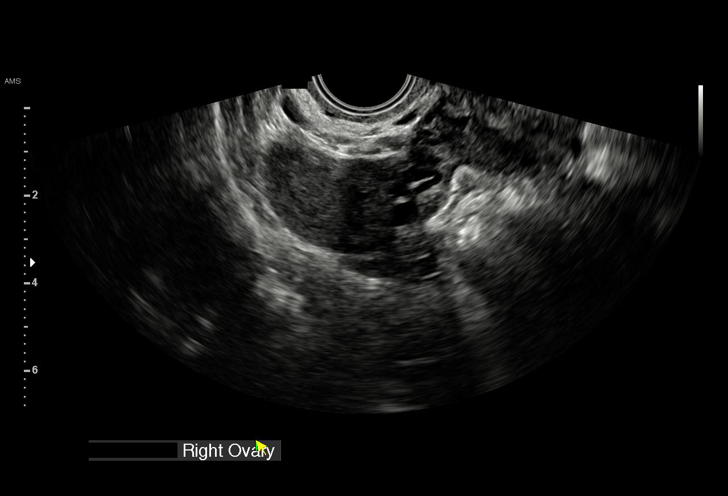
[im 24/31]
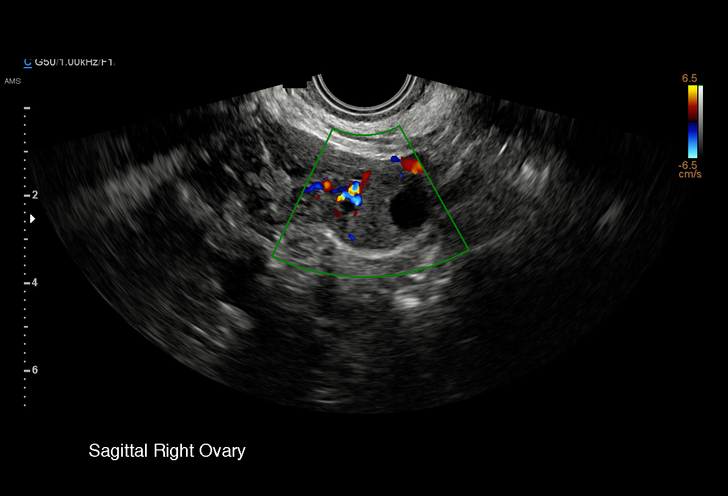
[im 26/31]
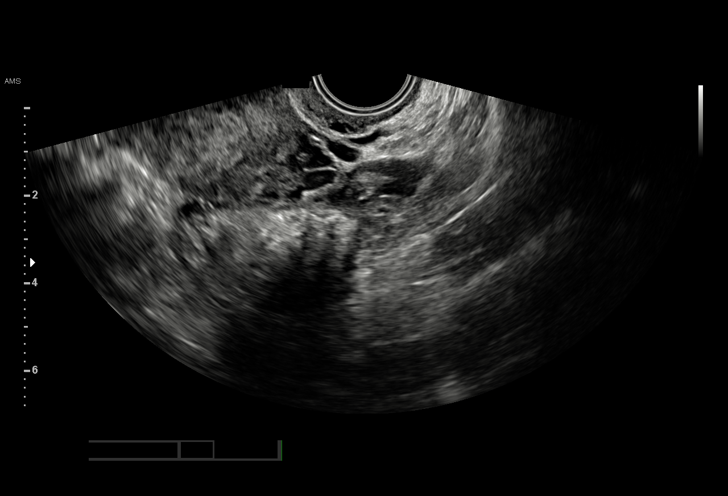
[im 28/31]
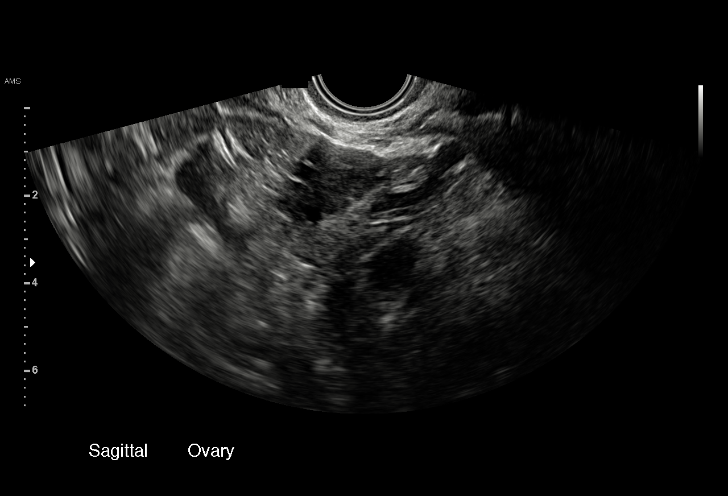
[im 31/31]
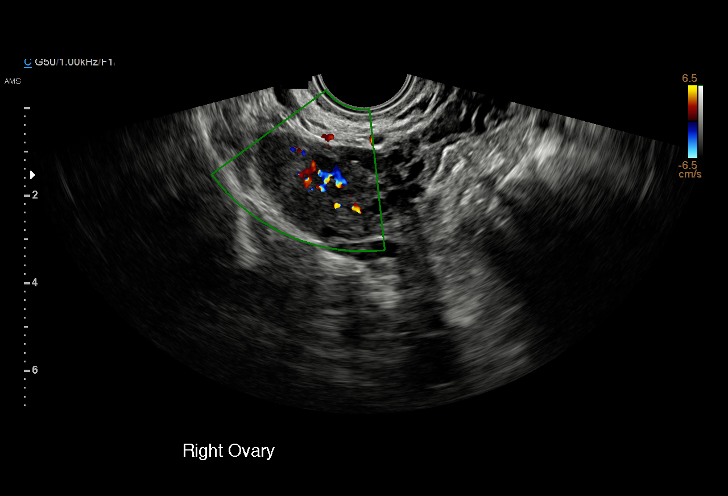

[15 of 28 positions shown; findings below may reference images not displayed]

FINDINGS: Intrauterine gestational sac: Single visualized.

Yolk sac:  Visualized.

Embryo:  Visualized.

Cardiac Activity: Visualized.

Heart Rate: 157 bpm

CRL:  12.62 mm   7 w   3 d                  US EDC: June 10, 2019.

Subchorionic hemorrhage:  None visualized.

Maternal uterus/adnexae: Ovaries are unremarkable. No free fluid is
noted.
IMPRESSION: Single live intrauterine gestation of 7 weeks 3 days.

## 2019-12-26 ENCOUNTER — Other Ambulatory Visit: Payer: Medicaid Other
